# Patient Record
Sex: Female | Born: 1945 | Race: White | Hispanic: No | Marital: Married | Smoking: Never smoker
Health system: Southern US, Community
[De-identification: ages and names within clinical notes are randomized; demographics above are authoritative.]

## PROBLEM LIST (undated history)

## (undated) DIAGNOSIS — E7800 Pure hypercholesterolemia, unspecified: Secondary | ICD-10-CM

## (undated) DIAGNOSIS — K219 Gastro-esophageal reflux disease without esophagitis: Secondary | ICD-10-CM

## (undated) DIAGNOSIS — E785 Hyperlipidemia, unspecified: Secondary | ICD-10-CM

## (undated) HISTORY — PX: HERNIA REPAIR: SHX51

---

## 2000-03-11 ENCOUNTER — Other Ambulatory Visit: Admission: RE | Admit: 2000-03-11 | Discharge: 2000-03-11 | Payer: Self-pay | Admitting: *Deleted

## 2000-03-14 ENCOUNTER — Encounter: Payer: Self-pay | Admitting: *Deleted

## 2000-03-14 ENCOUNTER — Encounter: Admission: RE | Admit: 2000-03-14 | Discharge: 2000-03-14 | Payer: Self-pay | Admitting: *Deleted

## 2001-06-13 ENCOUNTER — Emergency Department (HOSPITAL_COMMUNITY): Admission: EM | Admit: 2001-06-13 | Discharge: 2001-06-13 | Payer: Self-pay | Admitting: Emergency Medicine

## 2004-04-05 ENCOUNTER — Emergency Department (HOSPITAL_COMMUNITY): Admission: EM | Admit: 2004-04-05 | Discharge: 2004-04-05 | Payer: Self-pay | Admitting: Emergency Medicine

## 2010-01-31 ENCOUNTER — Encounter: Admission: RE | Admit: 2010-01-31 | Discharge: 2010-01-31 | Payer: Self-pay | Admitting: General Surgery

## 2010-08-25 ENCOUNTER — Encounter (HOSPITAL_COMMUNITY): Payer: Self-pay | Admitting: Oncology

## 2012-10-26 ENCOUNTER — Encounter (HOSPITAL_COMMUNITY): Payer: Self-pay | Admitting: *Deleted

## 2012-10-26 ENCOUNTER — Emergency Department (HOSPITAL_COMMUNITY)
Admission: EM | Admit: 2012-10-26 | Discharge: 2012-10-26 | Disposition: A | Discharge status: Home / Self Care | Payer: Medicare Other | Attending: Emergency Medicine | Admitting: Emergency Medicine

## 2012-10-26 DIAGNOSIS — R21 Rash and other nonspecific skin eruption: Secondary | ICD-10-CM | POA: Insufficient documentation

## 2012-10-26 DIAGNOSIS — R0789 Other chest pain: Secondary | ICD-10-CM | POA: Insufficient documentation

## 2012-10-26 DIAGNOSIS — Z79899 Other long term (current) drug therapy: Secondary | ICD-10-CM | POA: Insufficient documentation

## 2012-10-26 DIAGNOSIS — H9209 Otalgia, unspecified ear: Secondary | ICD-10-CM | POA: Insufficient documentation

## 2012-10-26 DIAGNOSIS — E7800 Pure hypercholesterolemia, unspecified: Secondary | ICD-10-CM | POA: Insufficient documentation

## 2012-10-26 DIAGNOSIS — M542 Cervicalgia: Secondary | ICD-10-CM | POA: Insufficient documentation

## 2012-10-26 DIAGNOSIS — R142 Eructation: Secondary | ICD-10-CM | POA: Insufficient documentation

## 2012-10-26 DIAGNOSIS — R143 Flatulence: Secondary | ICD-10-CM | POA: Insufficient documentation

## 2012-10-26 DIAGNOSIS — R141 Gas pain: Secondary | ICD-10-CM | POA: Insufficient documentation

## 2012-10-26 DIAGNOSIS — K219 Gastro-esophageal reflux disease without esophagitis: Secondary | ICD-10-CM | POA: Insufficient documentation

## 2012-10-26 HISTORY — DX: Pure hypercholesterolemia, unspecified: E78.00

## 2012-10-26 HISTORY — DX: Gastro-esophageal reflux disease without esophagitis: K21.9

## 2012-10-26 NOTE — ED Notes (Addendum)
Pt reports that 4 weeks ago, she had what she thought was an allergic reaction-redness, chest tightness, back pain and being flushed.  Reports that she went to her PCP and then to an allergist and could not find anything wrong-put her on prednisone (pt on 3rd round at this time) and clindamycin.  States that the 'episodes' are intermittent and are becoming more frequent.  Reports chest tightness and back tightness from her shoulder blades up and from her ribs up. All reports a rash-that itches- that forms on her arms, face, chest and abdomen.  Pt ambulatory.  Noted to be red in the face, denies pain with palpation.

## 2012-10-26 NOTE — ED Provider Notes (Addendum)
History     CSN: [id number]  Arrival date & time 10/26/12  1101   First MD Initiated Contact with Patient 10/26/12 1156      Chief Complaint  Patient presents with  . Allergic Reaction  . Chest Pain    (Consider location/radiation/quality/duration/timing/severity/associated sxs/prior treatment) HPI Comments: Kara [name] is a 67 y.o. Female who states that, she has had indeterminate and chest discomfort, primarily at night, that it is a pressure-like sensation, radiating to her neck and both ears. The discomfort tends to improve when she stands up, or belches. It lasts a few minutes at a time. There are no other known causative factors. She is on her third prescription of prednisone within the last 4 weeks for a generalized pruritic rash. She has been seeing her PCP, and an allergist. She is due to have "a patch test", because the skin test did not uncover an etiology. There is no associated weakness, dizziness, nausea, vomiting, abdominal pain, back pain, fever, chills, or change in bowel and urinary habits. She has not tried any medication for the problem. She is taking clindamycin for a secondary infection from scratching her rash. There are no other known modified factors.  Patient is a 67 y.o. female presenting with allergic reaction and chest pain. The history is provided by the patient.  Allergic Reaction  Chest Pain   Past Medical History  Diagnosis Date  . Hypercholesteremia   . Acid reflux     Past Surgical History  Procedure Laterality Date  . Hernia repair      History reviewed. No pertinent family history.  History  Substance Use Topics  . Smoking status: Never Smoker   . Smokeless tobacco: Not on file  . Alcohol Use: Yes     Comment: socially    OB History   Grav Para Term Preterm Abortions TAB SAB Ect Mult Living                  Review of Systems  Cardiovascular: Positive for chest pain.  All other systems reviewed and are  negative.    Allergies  Amoxicillin and Penicillins  Home Medications   Current Outpatient Rx  Name  Route  Sig  Dispense  Refill  . clindamycin (CLEOCIN) 300 MG capsule   Oral   Take 300 mg by mouth 3 (three) times daily.         Marland Kitchen dexlansoprazole (DEXILANT) 60 MG capsule   Oral   Take 60 mg by mouth daily.         . predniSONE (DELTASONE) 10 MG tablet   Oral   Take 10-60 mg by mouth daily.         . rosuvastatin (CRESTOR) 20 MG tablet   Oral   Take 20 mg by mouth daily.         Marland Kitchen tetrahydrozoline (VISINE) 0.05 % ophthalmic solution   Both Eyes   Place 1 drop into both eyes 4 (four) times daily as needed (for dry/irritated eyes).           BP 164/94  Pulse 108  Temp(Src) 98.3 F (36.8 C) (Oral)  Resp 18  SpO2 96%  Physical Exam  Nursing note and vitals reviewed. Constitutional: She is oriented to person, place, and time. She appears well-developed and well-nourished.  HENT:  Head: Normocephalic and atraumatic.  Eyes: Conjunctivae and EOM are normal. Pupils are equal, round, and reactive to light.  Neck: Normal range of motion and phonation normal. Neck supple.  Cardiovascular: Normal rate, regular rhythm and intact distal pulses.   Pulmonary/Chest: Effort normal and breath sounds normal. She exhibits no tenderness.  Abdominal: Soft. She exhibits no distension. There is no tenderness. There is no guarding.  Musculoskeletal: Normal range of motion.  Neurological: She is alert and oriented to person, place, and time. She has normal strength. She exhibits normal muscle tone.  Skin: Skin is warm and dry.  Scattered flat, red rash, primarily on the arms bilaterally; the pattern is indistinct.   Psychiatric: Her behavior is normal. Judgment and thought content normal.  Anxious    ED Course  Procedures (including critical care time)    Date: 05/22/2012  Rate: 96  Rhythm: normal sinus rhythm  QRS Axis: normal  PR and QT Intervals: PR prolonged   ST/T Wave abnormalities: normal  PR and QRS Conduction Disutrbances:none  Narrative Interpretation:   Old EKG Reviewed: none available     Nursing Notes Reviewed/ Care Coordinated Applicable Imaging Reviewed Interpretation of Laboratory Data incorporated into ED treatment   1. Chest pain, atypical       MDM  Chest pain, primarily at rest with symptoms consistent with GERD. I suspect this has been aggravated by her prolonged use of prednisone, complicated by the addition of clindamycin. ACS, PE, pneumonia. Doubt metabolic instability, serious bacterial infection or impending vascular collapse; the patient is stable for discharge.   Plan: Home Medications- add Maalox; Home Treatments- rest; Recommended follow up- PCP and allergist as scheduled and as needed.        Flint Melter, MD 10/26/12 1226  Flint Melter, MD 10/26/12 [phone]

## 2013-04-22 ENCOUNTER — Other Ambulatory Visit (HOSPITAL_COMMUNITY)
Admission: RE | Admit: 2013-04-22 | Discharge: 2013-04-22 | Disposition: A | Discharge status: Home / Self Care | Payer: Medicare Other | Source: Ambulatory Visit | Attending: Family Medicine | Admitting: Family Medicine

## 2013-04-22 ENCOUNTER — Other Ambulatory Visit: Payer: Self-pay | Admitting: Family Medicine

## 2013-04-22 DIAGNOSIS — Z124 Encounter for screening for malignant neoplasm of cervix: Secondary | ICD-10-CM | POA: Insufficient documentation

## 2013-04-22 DIAGNOSIS — Z1151 Encounter for screening for human papillomavirus (HPV): Secondary | ICD-10-CM | POA: Insufficient documentation

## 2013-07-12 ENCOUNTER — Other Ambulatory Visit: Payer: Self-pay | Admitting: Dermatology

## 2013-10-25 ENCOUNTER — Other Ambulatory Visit: Payer: Self-pay | Admitting: Dermatology

## 2014-10-17 ENCOUNTER — Other Ambulatory Visit: Payer: Self-pay

## 2014-10-17 DIAGNOSIS — Z1231 Encounter for screening mammogram for malignant neoplasm of breast: Secondary | ICD-10-CM

## 2014-11-10 ENCOUNTER — Ambulatory Visit
Admission: RE | Admit: 2014-11-10 | Discharge: 2014-11-10 | Disposition: A | Discharge status: Home / Self Care | Payer: Medicare Other | Source: Ambulatory Visit

## 2014-11-10 ENCOUNTER — Encounter (INDEPENDENT_AMBULATORY_CARE_PROVIDER_SITE_OTHER): Payer: Self-pay

## 2014-11-10 ENCOUNTER — Other Ambulatory Visit: Payer: Self-pay

## 2014-11-10 DIAGNOSIS — Z1231 Encounter for screening mammogram for malignant neoplasm of breast: Secondary | ICD-10-CM

## 2014-11-14 ENCOUNTER — Other Ambulatory Visit: Payer: Self-pay | Admitting: Family Medicine

## 2014-11-14 DIAGNOSIS — R928 Other abnormal and inconclusive findings on diagnostic imaging of breast: Secondary | ICD-10-CM

## 2014-11-18 ENCOUNTER — Ambulatory Visit
Admission: RE | Admit: 2014-11-18 | Discharge: 2014-11-18 | Disposition: A | Discharge status: Home / Self Care | Payer: Medicare Other | Source: Ambulatory Visit | Attending: Family Medicine | Admitting: Family Medicine

## 2014-11-18 ENCOUNTER — Other Ambulatory Visit: Payer: Self-pay | Admitting: Family Medicine

## 2014-11-18 DIAGNOSIS — R928 Other abnormal and inconclusive findings on diagnostic imaging of breast: Secondary | ICD-10-CM

## 2015-05-11 ENCOUNTER — Other Ambulatory Visit: Payer: Self-pay | Admitting: Family Medicine

## 2015-05-11 DIAGNOSIS — N6489 Other specified disorders of breast: Secondary | ICD-10-CM

## 2015-05-29 ENCOUNTER — Ambulatory Visit
Admission: RE | Admit: 2015-05-29 | Discharge: 2015-05-29 | Disposition: A | Discharge status: Home / Self Care | Payer: Medicare Other | Source: Ambulatory Visit | Attending: Family Medicine | Admitting: Family Medicine

## 2015-05-29 DIAGNOSIS — N6489 Other specified disorders of breast: Secondary | ICD-10-CM

## 2015-06-21 ENCOUNTER — Other Ambulatory Visit: Payer: Self-pay | Admitting: Gastroenterology

## 2015-12-11 ENCOUNTER — Other Ambulatory Visit: Payer: Self-pay | Admitting: Family Medicine

## 2015-12-11 DIAGNOSIS — N6489 Other specified disorders of breast: Secondary | ICD-10-CM

## 2015-12-20 ENCOUNTER — Ambulatory Visit
Admission: RE | Admit: 2015-12-20 | Discharge: 2015-12-20 | Disposition: A | Discharge status: Home / Self Care | Payer: Medicare Other | Source: Ambulatory Visit | Attending: Family Medicine | Admitting: Family Medicine

## 2015-12-20 DIAGNOSIS — N6489 Other specified disorders of breast: Secondary | ICD-10-CM

## 2016-02-15 ENCOUNTER — Other Ambulatory Visit: Payer: Self-pay | Admitting: Family Medicine

## 2016-02-15 DIAGNOSIS — H5711 Ocular pain, right eye: Secondary | ICD-10-CM

## 2016-02-19 ENCOUNTER — Ambulatory Visit
Admission: RE | Admit: 2016-02-19 | Discharge: 2016-02-19 | Disposition: A | Discharge status: Home / Self Care | Payer: Medicare Other | Source: Ambulatory Visit | Attending: Family Medicine | Admitting: Family Medicine

## 2016-02-19 DIAGNOSIS — H5711 Ocular pain, right eye: Secondary | ICD-10-CM

## 2016-08-28 DIAGNOSIS — Z0000 Encounter for general adult medical examination without abnormal findings: Secondary | ICD-10-CM | POA: Diagnosis not present

## 2016-08-28 DIAGNOSIS — D582 Other hemoglobinopathies: Secondary | ICD-10-CM | POA: Diagnosis not present

## 2016-08-28 DIAGNOSIS — R69 Illness, unspecified: Secondary | ICD-10-CM | POA: Diagnosis not present

## 2016-08-28 DIAGNOSIS — Z1389 Encounter for screening for other disorder: Secondary | ICD-10-CM | POA: Diagnosis not present

## 2016-08-28 DIAGNOSIS — E7800 Pure hypercholesterolemia, unspecified: Secondary | ICD-10-CM | POA: Diagnosis not present

## 2016-10-08 DIAGNOSIS — H5213 Myopia, bilateral: Secondary | ICD-10-CM | POA: Diagnosis not present

## 2016-10-08 DIAGNOSIS — H2513 Age-related nuclear cataract, bilateral: Secondary | ICD-10-CM | POA: Diagnosis not present

## 2016-11-13 ENCOUNTER — Other Ambulatory Visit: Payer: Self-pay | Admitting: Family Medicine

## 2016-11-13 DIAGNOSIS — R922 Inconclusive mammogram: Secondary | ICD-10-CM

## 2016-12-20 ENCOUNTER — Ambulatory Visit
Admission: RE | Admit: 2016-12-20 | Discharge: 2016-12-20 | Disposition: A | Discharge status: Home / Self Care | Payer: Medicare Other | Source: Ambulatory Visit | Attending: Family Medicine | Admitting: Family Medicine

## 2016-12-20 DIAGNOSIS — R922 Inconclusive mammogram: Secondary | ICD-10-CM

## 2016-12-20 DIAGNOSIS — R928 Other abnormal and inconclusive findings on diagnostic imaging of breast: Secondary | ICD-10-CM | POA: Diagnosis not present

## 2017-02-25 DIAGNOSIS — R202 Paresthesia of skin: Secondary | ICD-10-CM | POA: Diagnosis not present

## 2017-02-25 DIAGNOSIS — K219 Gastro-esophageal reflux disease without esophagitis: Secondary | ICD-10-CM | POA: Diagnosis not present

## 2017-02-25 DIAGNOSIS — E7800 Pure hypercholesterolemia, unspecified: Secondary | ICD-10-CM | POA: Diagnosis not present

## 2017-02-25 DIAGNOSIS — R69 Illness, unspecified: Secondary | ICD-10-CM | POA: Diagnosis not present

## 2017-02-25 DIAGNOSIS — D582 Other hemoglobinopathies: Secondary | ICD-10-CM | POA: Diagnosis not present

## 2017-03-05 DIAGNOSIS — J0100 Acute maxillary sinusitis, unspecified: Secondary | ICD-10-CM | POA: Diagnosis not present

## 2017-03-05 DIAGNOSIS — H66001 Acute suppurative otitis media without spontaneous rupture of ear drum, right ear: Secondary | ICD-10-CM | POA: Diagnosis not present

## 2017-04-22 DIAGNOSIS — Z85828 Personal history of other malignant neoplasm of skin: Secondary | ICD-10-CM | POA: Diagnosis not present

## 2017-04-22 DIAGNOSIS — D2271 Melanocytic nevi of right lower limb, including hip: Secondary | ICD-10-CM | POA: Diagnosis not present

## 2017-04-22 DIAGNOSIS — D2261 Melanocytic nevi of right upper limb, including shoulder: Secondary | ICD-10-CM | POA: Diagnosis not present

## 2017-04-22 DIAGNOSIS — L821 Other seborrheic keratosis: Secondary | ICD-10-CM | POA: Diagnosis not present

## 2017-04-22 DIAGNOSIS — D2262 Melanocytic nevi of left upper limb, including shoulder: Secondary | ICD-10-CM | POA: Diagnosis not present

## 2017-05-10 DIAGNOSIS — H66001 Acute suppurative otitis media without spontaneous rupture of ear drum, right ear: Secondary | ICD-10-CM | POA: Diagnosis not present

## 2017-05-10 DIAGNOSIS — S0300XA Dislocation of jaw, unspecified side, initial encounter: Secondary | ICD-10-CM | POA: Diagnosis not present

## 2017-07-07 DIAGNOSIS — T368X5A Adverse effect of other systemic antibiotics, initial encounter: Secondary | ICD-10-CM | POA: Diagnosis not present

## 2017-07-07 DIAGNOSIS — M549 Dorsalgia, unspecified: Secondary | ICD-10-CM | POA: Diagnosis not present

## 2017-07-07 DIAGNOSIS — K219 Gastro-esophageal reflux disease without esophagitis: Secondary | ICD-10-CM | POA: Diagnosis not present

## 2017-07-10 ENCOUNTER — Emergency Department (HOSPITAL_BASED_OUTPATIENT_CLINIC_OR_DEPARTMENT_OTHER): Payer: Medicare HMO

## 2017-07-10 ENCOUNTER — Other Ambulatory Visit: Payer: Self-pay

## 2017-07-10 ENCOUNTER — Emergency Department (HOSPITAL_BASED_OUTPATIENT_CLINIC_OR_DEPARTMENT_OTHER)
Admission: EM | Admit: 2017-07-10 | Discharge: 2017-07-10 | Disposition: A | Discharge status: Home / Self Care | Payer: Medicare HMO | Attending: Emergency Medicine | Admitting: Emergency Medicine

## 2017-07-10 ENCOUNTER — Encounter (HOSPITAL_BASED_OUTPATIENT_CLINIC_OR_DEPARTMENT_OTHER): Payer: Self-pay | Admitting: *Deleted

## 2017-07-10 DIAGNOSIS — E860 Dehydration: Secondary | ICD-10-CM | POA: Diagnosis not present

## 2017-07-10 DIAGNOSIS — I951 Orthostatic hypotension: Secondary | ICD-10-CM | POA: Diagnosis not present

## 2017-07-10 DIAGNOSIS — R002 Palpitations: Secondary | ICD-10-CM | POA: Diagnosis not present

## 2017-07-10 DIAGNOSIS — Z79899 Other long term (current) drug therapy: Secondary | ICD-10-CM | POA: Insufficient documentation

## 2017-07-10 DIAGNOSIS — R079 Chest pain, unspecified: Secondary | ICD-10-CM | POA: Diagnosis not present

## 2017-07-10 HISTORY — DX: Hyperlipidemia, unspecified: E78.5

## 2017-07-10 HISTORY — DX: Gastro-esophageal reflux disease without esophagitis: K21.9

## 2017-07-10 LAB — CBC
HEMATOCRIT: 47.6 % — AB (ref 36.0–46.0)
HEMOGLOBIN: 16.9 g/dL — AB (ref 12.0–15.0)
MCH: 30.7 pg (ref 26.0–34.0)
MCHC: 35.5 g/dL (ref 30.0–36.0)
MCV: 86.5 fL (ref 78.0–100.0)
Platelets: 238 10*3/uL (ref 150–400)
RBC: 5.5 MIL/uL — ABNORMAL HIGH (ref 3.87–5.11)
RDW: 12.9 % (ref 11.5–15.5)
WBC: 7.7 10*3/uL (ref 4.0–10.5)

## 2017-07-10 LAB — BASIC METABOLIC PANEL
ANION GAP: 7 (ref 5–15)
BUN: 12 mg/dL (ref 6–20)
CHLORIDE: 106 mmol/L (ref 101–111)
CO2: 27 mmol/L (ref 22–32)
Calcium: 9.5 mg/dL (ref 8.9–10.3)
Creatinine, Ser: 0.69 mg/dL (ref 0.44–1.00)
GFR calc Af Amer: >60 mL/min (ref >60)
GFR calc non Af Amer: >60 mL/min (ref >60)
GLUCOSE: 121 mg/dL — AB (ref 65–99)
POTASSIUM: 4.4 mmol/L (ref 3.5–5.1)
Sodium: 140 mmol/L (ref 135–145)

## 2017-07-10 LAB — TROPONIN I: Troponin I: <0.03 ng/mL (ref <0.03)

## 2017-07-10 MED ORDER — SODIUM CHLORIDE 0.9 % IV BOLUS (SEPSIS)
1000.0000 mL | Freq: Once | INTRAVENOUS | Status: AC
Start: 2017-07-10 — End: 2017-07-10
  Administered 2017-07-10: 1000 mL via INTRAVENOUS

## 2017-07-10 NOTE — ED Triage Notes (Signed)
Pt c/o palpitations " on and off" x 1 week

## 2017-07-10 NOTE — ED Provider Notes (Signed)
[facility] EMERGENCY DEPARTMENT Provider Note  CSN: [id number] Arrival date & time: 07/10/17 1249  Chief Complaint(s) Palpitations  HPI Kara [name] is a 71 y.o. female who presents to the emergency department with palpitations and sensation of "jitteriness."  Patient reports that this has happened in the past intermittently, but has been more frequent recently.  Today she had an episode that lasted longer than usual.  She was out eating lunch with her son when her symptoms started.  Her son reports that she appeared to be "focusing on something internally."  She did not appear pale or diaphoretic.  During this episode patient denied any lightheadedness, chest pain, shortness of breath, abdominal pain.  She did have mild nausea without emesis.  She denies any recent fevers or infections.  No URI symptoms, GI symptoms or GU symptoms.  Her palpitations have stopped however patient still feels jittery inside.  Patient does report that she has been more stressed recently with the holidays coming and has not been hydrating as well as she normally does.  HPI  Past Medical History Past Medical History:  Diagnosis Date  . Acid reflux   . GERD (gastroesophageal reflux disease)   . Hypercholesteremia   . Hyperlipidemia    There are no active problems to display for this patient.  Home Medication(s) Prior to Admission medications   Medication Sig Start Date End Date Taking? Authorizing Provider  omeprazole (PRILOSEC) 20 MG capsule Take 20 mg by mouth daily.   Yes [provider]  rosuvastatin (CRESTOR) 20 MG tablet Take 20 mg by mouth daily.    [provider]  tetrahydrozoline (VISINE) 0.05 % ophthalmic solution Place 1 drop into both eyes 4 (four) times daily as needed (for dry/irritated eyes).    [provider]                                                                                                                                    Past  Surgical History Past Surgical History:  Procedure Laterality Date  . HERNIA REPAIR     Family History History reviewed. No pertinent family history.  Social History Social History   Tobacco Use  . Smoking status: Never Smoker  . Smokeless tobacco: Never Used  Substance Use Topics  . Alcohol use: Yes    Comment: socially  . Drug use: No   Allergies Amoxicillin and Penicillins  Review of Systems Review of Systems All other systems are reviewed and are negative for acute change except as noted in the HPI  Physical Exam Vital Signs  I have reviewed the triage vital signs BP (!) 174/92 (BP Location: Left Arm)   Pulse 100   Temp 98.8 F (37.1 C)   Resp 16   Ht 5\' 3"  (1.6 m)   Wt 77.1 kg (170 lb)   SpO2 99%   BMI 30.11 kg/m   Physical Exam  Constitutional: She  is oriented to person, place, and time. She appears well-developed and well-nourished. No distress.  HENT:  Head: Normocephalic and atraumatic.  Nose: Nose normal.  Eyes: Conjunctivae and EOM are normal. Pupils are equal, round, and reactive to light. Right eye exhibits no discharge. Left eye exhibits no discharge. No scleral icterus.  Neck: Normal range of motion. Neck supple.  Cardiovascular: Normal rate and regular rhythm. Exam reveals no gallop and no friction rub.  No murmur heard. Pulmonary/Chest: Effort normal and breath sounds normal. No stridor. No respiratory distress. She has no rales.  Abdominal: Soft. She exhibits no distension. There is no tenderness.  Musculoskeletal: She exhibits no edema or tenderness.  Neurological: She is alert and oriented to person, place, and time.  Skin: Skin is warm and dry. No rash noted. She is not diaphoretic. No erythema.  Psychiatric: She has a normal mood and affect.  Vitals reviewed.   ED Results and Treatments Labs (all labs ordered are listed, but only abnormal results are displayed) Labs Reviewed  BASIC METABOLIC PANEL - Abnormal; Notable for the  following components:      Result Value   Glucose, Bld 121 (*)    All other components within normal limits  CBC - Abnormal; Notable for the following components:   RBC 5.50 (*)    Hemoglobin 16.9 (*)    HCT 47.6 (*)    All other components within normal limits  TROPONIN I                                                                                                                         EKG  EKG Interpretation  Date/Time:  Thursday July 10 2017 12:54:15 EST Ventricular Rate:  110 PR Interval:  160 QRS Duration: 86 QT Interval:  328 QTC Calculation: 443 R Axis:   -38 Text Interpretation:  Sinus tachycardia Left axis deviation Minimal voltage criteria for LVH, may be normal variant Possible Anteroseptal infarct , age undetermined Abnormal ECG Otherwise no significant change Confirmed by Addison Lank [phone]) on 07/10/2017 1:18:41 PM Also confirmed by Addison Lank [phone]), editor Laurena Spies [phone])  on 07/10/2017 1:39:17 PM      Radiology Dg Chest 2 View  Result Date: 07/10/2017 CLINICAL DATA:  Chest pain EXAM: CHEST  2 VIEW COMPARISON:  01/31/2010 FINDINGS: The heart size and mediastinal contours are within normal limits. Both lungs are clear. Spondylosis noted in the thoracic spine. IMPRESSION: No active cardiopulmonary disease. Electronically Signed   By: Kerby Moors M.D.   On: 07/10/2017 13:29   Pertinent labs & imaging results that were available during my care of the patient were reviewed by me and considered in my medical decision making (see chart for details).  Medications Ordered in ED Medications  sodium chloride 0.9 % bolus 1,000 mL (0 mLs Intravenous Stopped 07/10/17 1449)  Procedures Procedures  (including critical care time)  Medical Decision Making / ED Course I have reviewed the nursing notes for this encounter and  the patient's prior records (if available in EHR or on provided paperwork).    EKG without acute ischemic changes or dysrhythmias.  No blocks.  On telemetry monitor I did note intermittent PACs.  Orthostatics were positive.  Labs were grossly reassuring without significant electrolyte derangements or renal insufficiency.  CBC did appear hemoconcentrated.  She was provided with IV fluid bolus which completely resolved the patient's jitteriness.  Repeat orthostatics were reassuring.  The patient is safe for discharge with strict return precautions.   Final Clinical Impression(s) / ED Diagnoses Final diagnoses:  Palpitations  Orthostasis  Dehydration   Disposition: Discharge  Condition: Good  I have discussed the results, Dx and Tx plan with the patient who expressed understanding and agree(s) with the plan. Discharge instructions discussed at great length. The patient was given strict return precautions who verbalized understanding of the instructions. No further questions at time of discharge.    ED Discharge Orders    None       Follow Up: [address], [place] [address] [phone]  Schedule an appointment as soon as possible for a visit  As needed      This chart was dictated using voice recognition software.  Despite best efforts to proofread,  errors can occur which can change the documentation meaning.   Fatima Blank, MD 07/10/17 [phone]

## 2017-07-21 DIAGNOSIS — J0140 Acute pansinusitis, unspecified: Secondary | ICD-10-CM | POA: Diagnosis not present

## 2017-07-21 DIAGNOSIS — H66001 Acute suppurative otitis media without spontaneous rupture of ear drum, right ear: Secondary | ICD-10-CM | POA: Diagnosis not present

## 2017-09-01 DIAGNOSIS — Z0000 Encounter for general adult medical examination without abnormal findings: Secondary | ICD-10-CM | POA: Diagnosis not present

## 2017-09-01 DIAGNOSIS — E7800 Pure hypercholesterolemia, unspecified: Secondary | ICD-10-CM | POA: Diagnosis not present

## 2017-09-01 DIAGNOSIS — Z1389 Encounter for screening for other disorder: Secondary | ICD-10-CM | POA: Diagnosis not present

## 2017-09-01 DIAGNOSIS — K219 Gastro-esophageal reflux disease without esophagitis: Secondary | ICD-10-CM | POA: Diagnosis not present

## 2017-09-01 DIAGNOSIS — D582 Other hemoglobinopathies: Secondary | ICD-10-CM | POA: Diagnosis not present

## 2017-09-01 DIAGNOSIS — R69 Illness, unspecified: Secondary | ICD-10-CM | POA: Diagnosis not present

## 2017-09-08 ENCOUNTER — Telehealth: Payer: Self-pay | Admitting: Hematology and Oncology

## 2017-09-08 ENCOUNTER — Encounter: Payer: Self-pay | Admitting: Hematology and Oncology

## 2017-09-08 NOTE — Telephone Encounter (Signed)
Appt has been scheduled for the pt to see Dr. Lebron Conners on 3/4 at 11am. Address verified. Letter mailed to the pt and faxed to the referring office.

## 2017-10-06 ENCOUNTER — Telehealth: Payer: Self-pay | Admitting: Hematology and Oncology

## 2017-10-06 ENCOUNTER — Encounter: Payer: Medicare HMO | Admitting: Hematology and Oncology

## 2017-10-06 NOTE — Telephone Encounter (Signed)
Pt cld to cancel today's appt due to an illness. Appt has been rescheduled to see Dr. Lebron Conners on 3/22 at 2pm.

## 2017-10-14 DIAGNOSIS — H5213 Myopia, bilateral: Secondary | ICD-10-CM | POA: Diagnosis not present

## 2017-10-14 DIAGNOSIS — H2513 Age-related nuclear cataract, bilateral: Secondary | ICD-10-CM | POA: Diagnosis not present

## 2017-10-24 ENCOUNTER — Inpatient Hospital Stay: Payer: Medicare HMO

## 2017-10-24 ENCOUNTER — Encounter: Payer: Self-pay | Admitting: Hematology and Oncology

## 2017-10-24 ENCOUNTER — Telehealth: Payer: Self-pay | Admitting: Hematology and Oncology

## 2017-10-24 ENCOUNTER — Inpatient Hospital Stay: Payer: Medicare HMO | Attending: Hematology and Oncology | Admitting: Hematology and Oncology

## 2017-10-24 VITALS — BP 136/92 | HR 94 | Temp 97.8°F | Resp 20 | Ht 63.0 in | Wt 188.6 lb

## 2017-10-24 DIAGNOSIS — D751 Secondary polycythemia: Secondary | ICD-10-CM | POA: Diagnosis present

## 2017-10-24 DIAGNOSIS — E7800 Pure hypercholesterolemia, unspecified: Secondary | ICD-10-CM | POA: Diagnosis not present

## 2017-10-24 DIAGNOSIS — Z7722 Contact with and (suspected) exposure to environmental tobacco smoke (acute) (chronic): Secondary | ICD-10-CM | POA: Diagnosis not present

## 2017-10-24 DIAGNOSIS — F419 Anxiety disorder, unspecified: Secondary | ICD-10-CM | POA: Diagnosis not present

## 2017-10-24 DIAGNOSIS — D582 Other hemoglobinopathies: Secondary | ICD-10-CM | POA: Diagnosis not present

## 2017-10-24 DIAGNOSIS — R69 Illness, unspecified: Secondary | ICD-10-CM | POA: Diagnosis not present

## 2017-10-24 DIAGNOSIS — K219 Gastro-esophageal reflux disease without esophagitis: Secondary | ICD-10-CM | POA: Diagnosis not present

## 2017-10-24 DIAGNOSIS — Z79899 Other long term (current) drug therapy: Secondary | ICD-10-CM | POA: Diagnosis not present

## 2017-10-24 LAB — CMP (CANCER CENTER ONLY)
ALBUMIN: 4.4 g/dL (ref 3.5–5.0)
ALK PHOS: 92 U/L (ref 40–150)
ALT: 19 U/L (ref 0–55)
AST: 16 U/L (ref 5–34)
Anion gap: 10 (ref 3–11)
BILIRUBIN TOTAL: 1.7 mg/dL — AB (ref 0.2–1.2)
BUN: 14 mg/dL (ref 7–26)
CALCIUM: 10 mg/dL (ref 8.4–10.4)
CO2: 25 mmol/L (ref 22–29)
CREATININE: 0.8 mg/dL (ref 0.60–1.10)
Chloride: 106 mmol/L (ref 98–109)
GFR, Est AFR Am: >60 mL/min (ref >60)
GFR, Estimated: >60 mL/min (ref >60)
Glucose, Bld: 93 mg/dL (ref 70–140)
Potassium: 3.9 mmol/L (ref 3.5–5.1)
Sodium: 141 mmol/L (ref 136–145)
TOTAL PROTEIN: 7.2 g/dL (ref 6.4–8.3)

## 2017-10-24 LAB — CBC WITH DIFFERENTIAL (CANCER CENTER ONLY)
BASOS PCT: 1 %
Basophils Absolute: 0 10*3/uL (ref 0.0–0.1)
Eosinophils Absolute: 0.1 10*3/uL (ref 0.0–0.5)
Eosinophils Relative: 1 %
HEMATOCRIT: 48.5 % — AB (ref 34.8–46.6)
HEMOGLOBIN: 16.6 g/dL — AB (ref 11.6–15.9)
Lymphocytes Relative: 27 %
Lymphs Abs: 2.4 10*3/uL (ref 0.9–3.3)
MCH: 30.3 pg (ref 25.1–34.0)
MCHC: 34.3 g/dL (ref 31.5–36.0)
MCV: 88.5 fL (ref 79.5–101.0)
Monocytes Absolute: 0.6 10*3/uL (ref 0.1–0.9)
Monocytes Relative: 7 %
NEUTROS ABS: 5.7 10*3/uL (ref 1.5–6.5)
NEUTROS PCT: 64 %
Platelet Count: 236 10*3/uL (ref 145–400)
RBC: 5.48 MIL/uL — ABNORMAL HIGH (ref 3.70–5.45)
RDW: 12.9 % (ref 11.2–14.5)
WBC Count: 8.8 10*3/uL (ref 3.9–10.3)

## 2017-10-24 LAB — LACTATE DEHYDROGENASE: LDH: 186 U/L (ref 125–245)

## 2017-10-24 NOTE — Telephone Encounter (Signed)
Appointments scheduled AVS printed per 3/22 los ° °

## 2017-10-27 LAB — ERYTHROPOIETIN: Erythropoietin: 14.1 m[IU]/mL (ref 2.6–18.5)

## 2017-10-30 DIAGNOSIS — D751 Secondary polycythemia: Secondary | ICD-10-CM | POA: Insufficient documentation

## 2017-10-30 NOTE — Progress Notes (Signed)
[facility] New Visit:  Assessment: Elevated hemoglobin ([name]) 72 y.o. female with no significant contributory medical history, but mildly elevated hemoglobin and hematocrit without any symptoms suggestive of polycythemia.  Plan: -Labs today as outlined below. -Return to clinic in 1 week to discuss the findings.   Voice recognition software was used and creation of this note. Despite my best effort at editing the text, some misspelling/errors may have occurred. Orders Placed This Encounter  Procedures  . CBC with Differential ([facility] Only)  . CMP ([facility] only)  . Lactate dehydrogenase (LDH)  . Erythropoietin    All questions were answered.  . The patient knows to call the clinic with any problems, questions or concerns.  This note was electronically signed.    History of Presenting Illness Kara [name] 72 y.o. presenting to the [facility] for persistent hemoglobin evaluation, referred by Dr Kara [name].  Patient's past medical history significant for insomnia, hyperlipidemia, GERD.  Patient is a non-smoker, but has significant exposure to secondhand smoke.  Of note, her son also has elevated hemoglobin, patient  is not aware of any positive diagnosis in her son.  This time, patient denies any fevers, chills, night sweats.  No facial flushing or redness or pain in the palm of the hands or soles of the feet.  Patient denies abdominal pain, fullness, or early satiety.  No headaches, vision changes, or focal neurological deficits.  Oncological/hematological History: --Labs, 07/10/17: WBC 7.7, Hgb 16.9. Hct 47.6, Plt 238 --Labs, 09/01/17: WBC 7.7, Hgb 16.4, Hct 48.0, Plt 234  Medical History: Past Medical History:  Diagnosis Date  . Acid reflux   . GERD (gastroesophageal reflux disease)   . Hypercholesteremia   . Hyperlipidemia     Surgical History: Past Surgical History:  Procedure Laterality Date  . HERNIA REPAIR      Family  History: No family history on file.  Social History: Social History   Socioeconomic History  . Marital status: Married    Spouse name: Not on file  . Number of children: Not on file  . Years of education: Not on file  . Highest education level: Not on file  Occupational History  . Not on file  Social Needs  . Financial resource strain: Not on file  . Food insecurity:    Worry: Not on file    Inability: Not on file  . Transportation needs:    Medical: Not on file    Non-medical: Not on file  Tobacco Use  . Smoking status: Never Smoker  . Smokeless tobacco: Never Used  Substance and Sexual Activity  . Alcohol use: Yes    Comment: socially  . Drug use: No  . Sexual activity: Not on file  Lifestyle  . Physical activity:    Days per week: Not on file    Minutes per session: Not on file  . Stress: Not on file  Relationships  . Social connections:    Talks on phone: Not on file    Gets together: Not on file    Attends religious service: Not on file    Active member of club or organization: Not on file    Attends meetings of clubs or organizations: Not on file    Relationship status: Not on file  . Intimate partner violence:    Fear of current or ex partner: Not on file    Emotionally abused: Not on file    Physically abused: Not on file  Forced sexual activity: Not on file  Other Topics Concern  . Not on file  Social History Narrative  . Not on file    Allergies: Allergies  Allergen Reactions  . Amoxicillin Hives  . Penicillins Hives    Medications:  Current Outpatient Medications  Medication Sig Dispense Refill  . ALPRAZolam (XANAX) 0.5 MG tablet Take 0.5 mg by mouth daily as needed for anxiety.    Marland Kitchen omeprazole (PRILOSEC) 20 MG capsule Take 20 mg by mouth daily.    . rosuvastatin (CRESTOR) 20 MG tablet Take 20 mg by mouth daily.    Marland Kitchen tetrahydrozoline (VISINE) 0.05 % ophthalmic solution Place 1 drop into both eyes 4 (four) times daily as needed (for  dry/irritated eyes).     No current facility-administered medications for this visit.     Review of Systems: Review of Systems  All other systems reviewed and are negative.    PHYSICAL EXAMINATION Blood pressure (!) 136/92, pulse 94, temperature 97.8 F (36.6 C), temperature source Oral, resp. rate 20, height '5\' 3"'$  (1.6 m), weight 188 lb 9.6 oz (85.5 kg), SpO2 97 %.  ECOG PERFORMANCE STATUS: 0 - Asymptomatic  Physical Exam  Constitutional: She is oriented to person, place, and time and well-developed, well-nourished, and in no distress. No distress.  HENT:  Head: Normocephalic and atraumatic.  Mouth/Throat: Oropharynx is clear and moist. No oropharyngeal exudate.  Eyes: Pupils are equal, round, and reactive to light. Conjunctivae and EOM are normal. No scleral icterus.  Neck: No thyromegaly present.  Cardiovascular: Normal rate, regular rhythm, normal heart sounds and intact distal pulses.  No murmur heard. Pulmonary/Chest: Effort normal and breath sounds normal. No respiratory distress. She has no wheezes. She has no rales.  Abdominal: Soft. Bowel sounds are normal. She exhibits no distension and no mass. There is no tenderness. There is no guarding.  Musculoskeletal: She exhibits no edema.  Lymphadenopathy:    She has no cervical adenopathy.  Neurological: She is alert and oriented to person, place, and time. She has normal reflexes. No cranial nerve deficit.  Skin: Skin is warm and dry. No rash noted. She is not diaphoretic. No erythema. No pallor.     LABORATORY DATA: I have personally reviewed the data as listed: Office Visit on 10/24/2017  Component Date Value Ref Range Status  . WBC Count 10/24/2017 8.8  3.9 - 10.3 K/uL Final  . RBC 10/24/2017 5.48* 3.70 - 5.45 MIL/uL Final  . Hemoglobin 10/24/2017 16.6* 11.6 - 15.9 g/dL Final  . HCT 10/24/2017 48.5* 34.8 - 46.6 % Final  . MCV 10/24/2017 88.5  79.5 - 101.0 fL Final  . MCH 10/24/2017 30.3  25.1 - 34.0 pg Final  .  MCHC 10/24/2017 34.3  31.5 - 36.0 g/dL Final  . RDW 10/24/2017 12.9  11.2 - 14.5 % Final  . Platelet Count 10/24/2017 236  145 - 400 K/uL Final  . Neutrophils Relative % 10/24/2017 64  % Final  . Neutro Abs 10/24/2017 5.7  1.5 - 6.5 K/uL Final  . Lymphocytes Relative 10/24/2017 27  % Final  . Lymphs Abs 10/24/2017 2.4  0.9 - 3.3 K/uL Final  . Monocytes Relative 10/24/2017 7  % Final  . Monocytes Absolute 10/24/2017 0.6  0.1 - 0.9 K/uL Final  . Eosinophils Relative 10/24/2017 1  % Final  . Eosinophils Absolute 10/24/2017 0.1  0.0 - 0.5 K/uL Final  . Basophils Relative 10/24/2017 1  % Final  . Basophils Absolute 10/24/2017 0.0  0.0 - 0.1  K/uL Final   Performed at [facility], [place] [address], [place], [place] [address]  . Sodium 10/24/2017 141  136 - 145 mmol/L Final  . Potassium 10/24/2017 3.9  3.5 - 5.1 mmol/L Final  . Chloride 10/24/2017 106  98 - 109 mmol/L Final  . CO2 10/24/2017 25  22 - 29 mmol/L Final  . Glucose, Bld 10/24/2017 93  70 - 140 mg/dL Final  . BUN 10/24/2017 14  7 - 26 mg/dL Final  . Creatinine 10/24/2017 0.80  0.60 - 1.10 mg/dL Final  . Calcium 10/24/2017 10.0  8.4 - 10.4 mg/dL Final  . Total Protein 10/24/2017 7.2  6.4 - 8.3 g/dL Final  . Albumin 10/24/2017 4.4  3.5 - 5.0 g/dL Final  . AST 10/24/2017 16  5 - 34 U/L Final  . ALT 10/24/2017 19  0 - 55 U/L Final  . Alkaline Phosphatase 10/24/2017 92  40 - 150 U/L Final  . Total Bilirubin 10/24/2017 1.7* 0.2 - 1.2 mg/dL Final  . GFR, Est Non Af Am 10/24/2017 >60  >60 mL/min Final  . GFR, Est AFR Am 10/24/2017 >60  >60 mL/min Final   Comment: (NOTE) The eGFR has been calculated using the CKD EPI equation. This calculation has not been validated in all clinical situations. eGFR's persistently <60 mL/min signify possible Chronic Kidney Disease.   Georgiann Hahn gap 10/24/2017 10  3 - 11 Final   Performed at [facility], [place] [address] [place], [place] [address]  . LDH  10/24/2017 186  125 - 245 U/L Final   Performed at [facility], [place] [address], [place], [place] [address]  . Erythropoietin 10/24/2017 14.1  2.6 - 18.5 mIU/mL Final   Comment: (NOTE) [employer] UniCel DxI Leonardo obtained with different assay methods or kits cannot be used interchangeably. Results cannot be interpreted as absolute evidence of the presence or absence of malignant disease. Performed At: [facility] [place], [place] [id number] Rush Farmer MD KO:[id number] Performed at [facility], [place] [address] [place], [place] [address]          Ardath Sax, MD

## 2017-10-30 NOTE — Assessment & Plan Note (Signed)
72 y.o. female with no significant contributory medical history, but mildly elevated hemoglobin and hematocrit without any symptoms suggestive of polycythemia.  Plan: -Labs today as outlined below. -Return to clinic in 1 week to discuss the findings.

## 2017-10-31 ENCOUNTER — Telehealth: Payer: Self-pay | Admitting: Hematology and Oncology

## 2017-10-31 ENCOUNTER — Inpatient Hospital Stay (HOSPITAL_BASED_OUTPATIENT_CLINIC_OR_DEPARTMENT_OTHER): Payer: Medicare HMO | Admitting: Hematology and Oncology

## 2017-10-31 ENCOUNTER — Encounter: Payer: Self-pay | Admitting: Hematology and Oncology

## 2017-10-31 VITALS — BP 152/83 | HR 85 | Temp 98.0°F | Resp 17 | Ht 63.0 in | Wt 186.8 lb

## 2017-10-31 DIAGNOSIS — E7800 Pure hypercholesterolemia, unspecified: Secondary | ICD-10-CM | POA: Diagnosis not present

## 2017-10-31 DIAGNOSIS — D751 Secondary polycythemia: Secondary | ICD-10-CM | POA: Diagnosis not present

## 2017-10-31 DIAGNOSIS — K219 Gastro-esophageal reflux disease without esophagitis: Secondary | ICD-10-CM

## 2017-10-31 DIAGNOSIS — Z79899 Other long term (current) drug therapy: Secondary | ICD-10-CM | POA: Diagnosis not present

## 2017-10-31 DIAGNOSIS — F419 Anxiety disorder, unspecified: Secondary | ICD-10-CM | POA: Diagnosis not present

## 2017-10-31 DIAGNOSIS — Z7722 Contact with and (suspected) exposure to environmental tobacco smoke (acute) (chronic): Secondary | ICD-10-CM | POA: Diagnosis not present

## 2017-10-31 DIAGNOSIS — R69 Illness, unspecified: Secondary | ICD-10-CM | POA: Diagnosis not present

## 2017-10-31 DIAGNOSIS — D582 Other hemoglobinopathies: Secondary | ICD-10-CM | POA: Diagnosis not present

## 2017-10-31 NOTE — Telephone Encounter (Signed)
Appointments scheduled AVS/Calendar printed per 3/29 los °

## 2017-11-02 NOTE — Progress Notes (Signed)
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  Previous social history significant for secondhand exposure to tobacco smoke.  Erythropoietin level is elevated at 14.1 with persistent and may be progressive elevation in hemoglobin suggestive of secondary polycythemia.  Next phase and evaluation would be to assess for possible presence of inappropriate erythropoietin production such as presence of renal cell carcinoma, uterine malignancies, or hepatocellular carcinoma which have been shown to produce erythropoietin.  Plan: -CT of the abdomen and pelvis to assess for possible presence of liver, renal, or uterine tumors. -Chest x-ray and pulmonary function test to assess for possible presence of undiagnosed COPD -Return to clinic in 6 months for continued hematological monitoring or sooner if significant findings are obtained during the imaging.  Therapeutic phlebotomy will be reserved for hematocrit exceeding 55%.   Voice recognition software was used and creation of this note. Despite my best effort at editing the text, some misspelling/errors may have occurred.  Orders Placed This Encounter  Procedures  . DG Chest 2 View    Standing Status:   Future    Standing Expiration Date:   10/31/2018    Order Specific Question:   Reason for Exam (SYMPTOM  OR DIAGNOSIS REQUIRED)    Answer:   Elevated erythropoietin, please eval for COPD    Order Specific Question:   Preferred imaging location?    Answer:   [facility]    Order Specific Question:   Radiology Contrast Protocol - do NOT remove file path    Answer:   \\[email or web address]\[email or web address].[email or web address]  . CT Abdomen Pelvis W Contrast    Standing Status:   Future    Standing Expiration Date:   10/31/2018    Order Specific  Question:   If indicated for the ordered procedure, I authorize the administration of contrast media per Radiology protocol    Answer:   Yes    Order Specific Question:   Preferred imaging location?    Answer:   [facility]    Order Specific Question:   Radiology Contrast Protocol - do NOT remove file path    Answer:   \\[email or web address]\CTProtocols.[email or web address]    Order Specific Question:   Reason for Exam additional comments    Answer:   Elevated epo, please eval for hepatic, renal, or uterine abnormalities to suggest malignancy  . CBC with Differential (Cancer Center Only)    Standing Status:   Future    Standing Expiration Date:   11/01/2018  . CMP ([place] only)    Standing Status:   Future    Standing Expiration Date:   11/01/2018  . Lactate dehydrogenase (LDH)    Standing Status:   Future    Standing Expiration Date:   10/31/2018  . Erythropoietin    Standing Status:   Future    Standing Expiration Date:   10/31/2018  . Pulmonary Function Test    Standing Status:   Future    Standing Expiration Date:   11/01/2018    Order Specific Question:   Where should this test be performed?    Answer:   [facility]    Order Specific Question:   Full PFT: includes the following: basic spirometry, spirometry pre & post bronchodilator, diffusion capacity (DLCO), lung volumes    Answer:   Full PFT    Order Specific Question:   MIP/MEP    Answer:  No    Order Specific Question:   6 minute walk    Answer:   No    Order Specific Question:   ABG    Answer:   Yes    Order Specific Question:   Diffusion capacity (DLCO)    Answer:   Yes    Order Specific Question:   Lung volumes    Answer:   No    Order Specific Question:   Methacholine challenge    Answer:   No    Cancer Staging No matching staging information was found for the patient.  All questions were answered. . The patient knows to call the clinic with any problems, questions or concerns.  This note was  electronically signed.    History of Presenting Illness Kara [name] is a 72 y.o. 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  Patient's past medical history significant for insomnia, hyperlipidemia, GERD.  Patient is a non-smoker, but has significant exposure to secondhand smoke both from both parents smoking during her childhood as well as during prior previous work.  Of note, her son also has elevated hemoglobin, patient  is not aware of any positive diagnosis in her son.  This time, patient denies any fevers, chills, night sweats.  No facial flushing or redness or pain in the palm of the hands or soles of the feet.  Patient denies abdominal pain, fullness, or early satiety.  No headaches, vision changes, or focal neurological deficits.  Oncological/hematological History: --Labs, 07/10/17: WBC 7.7, Hgb 16.9. Hct 47.6, Plt 238 --Labs, 09/01/17: WBC 7.7, Hgb 16.4, Hct 48.0, Plt 234 --Labs, 10/24/17: WBC 8.8, Hgb 16.6, Hct 48.5, Plt 236; Epo 14.1  No history exists.    Medical History: Past Medical History:  Diagnosis Date  . Acid reflux   . GERD (gastroesophageal reflux disease)   . Hypercholesteremia   . Hyperlipidemia     Surgical History: Past Surgical History:  Procedure Laterality Date  . HERNIA REPAIR      Family History: History reviewed. No pertinent family history.  Social History: Social History   Socioeconomic History  . Marital status: Married    Spouse name: Not on file  . Number of children: Not on file  . Years of education: Not on file  . Highest education level: Not on file  Occupational History  . Not on file  Social Needs  . Financial resource strain: Not on file  . Food insecurity:    Worry: Not on file    Inability: Not on file  . Transportation needs:    Medical: Not on file    Non-medical: Not on file  Tobacco Use  . Smoking status: Never Smoker  . Smokeless tobacco: Never Used   Substance and Sexual Activity  . Alcohol use: Yes    Comment: socially  . Drug use: No  . Sexual activity: Not on file  Lifestyle  . Physical activity:    Days per week: Not on file    Minutes per session: Not on file  . Stress: Not on file  Relationships  . Social connections:    Talks on phone: Not on file    Gets together: Not on file    Attends religious service: Not on file    Active member of club or organization: Not on file    Attends meetings of clubs or organizations: Not on file    Relationship status: Not on file  . Intimate  partner violence:    Fear of current or ex partner: Not on file    Emotionally abused: Not on file    Physically abused: Not on file    Forced sexual activity: Not on file  Other Topics Concern  . Not on file  Social History Narrative  . Not on file    Allergies: Allergies  Allergen Reactions  . Amoxicillin Hives  . Penicillins Hives    Medications:  Current Outpatient Medications  Medication Sig Dispense Refill  . ALPRAZolam (XANAX) 0.5 MG tablet Take 0.5 mg by mouth daily as needed for anxiety.    Marland Kitchen omeprazole (PRILOSEC) 20 MG capsule Take 20 mg by mouth daily.    . rosuvastatin (CRESTOR) 20 MG tablet Take 20 mg by mouth daily.    Marland Kitchen tetrahydrozoline (VISINE) 0.05 % ophthalmic solution Place 1 drop into both eyes 4 (four) times daily as needed (for dry/irritated eyes).     No current facility-administered medications for this visit.     Review of Systems: Review of Systems  All other systems reviewed and are negative.    PHYSICAL EXAMINATION Blood pressure (!) 152/83, pulse 85, temperature 98 F (36.7 C), temperature source Oral, resp. rate 17, height '5\' 3"'$  (1.6 m), weight 186 lb 12.8 oz (84.7 kg), SpO2 99 %.  ECOG PERFORMANCE STATUS: 0 - Asymptomatic  Physical Exam  Constitutional: She is oriented to person, place, and time and well-developed, well-nourished, and in no distress. No distress.  HENT:  Head: Normocephalic  and atraumatic.  Mouth/Throat: Oropharynx is clear and moist. No oropharyngeal exudate.  Eyes: Pupils are equal, round, and reactive to light. Conjunctivae and EOM are normal. No scleral icterus.  Neck: No thyromegaly present.  Cardiovascular: Normal rate, regular rhythm, normal heart sounds and intact distal pulses.  No murmur heard. Pulmonary/Chest: Effort normal and breath sounds normal. No respiratory distress. She has no wheezes. She has no rales.  Abdominal: Soft. Bowel sounds are normal. She exhibits no distension and no mass. There is no tenderness. There is no guarding.  Musculoskeletal: She exhibits no edema.  Lymphadenopathy:    She has no cervical adenopathy.  Neurological: She is alert and oriented to person, place, and time. She has normal reflexes. No cranial nerve deficit.  Skin: Skin is warm and dry. No rash noted. She is not diaphoretic. No erythema. No pallor.     LABORATORY DATA: I have personally reviewed the data as listed: No visits with results within 1 Week(s) from this visit.  Latest known visit with results is:  Office Visit on 10/24/2017  Component Date Value Ref Range Status  . WBC Count 10/24/2017 8.8  3.9 - 10.3 K/uL Final  . RBC 10/24/2017 5.48* 3.70 - 5.45 MIL/uL Final  . Hemoglobin 10/24/2017 16.6* 11.6 - 15.9 g/dL Final  . HCT 10/24/2017 48.5* 34.8 - 46.6 % Final  . MCV 10/24/2017 88.5  79.5 - 101.0 fL Final  . MCH 10/24/2017 30.3  25.1 - 34.0 pg Final  . MCHC 10/24/2017 34.3  31.5 - 36.0 g/dL Final  . RDW 10/24/2017 12.9  11.2 - 14.5 % Final  . Platelet Count 10/24/2017 236  145 - 400 K/uL Final  . Neutrophils Relative % 10/24/2017 64  % Final  . Neutro Abs 10/24/2017 5.7  1.5 - 6.5 K/uL Final  . Lymphocytes Relative 10/24/2017 27  % Final  . Lymphs Abs 10/24/2017 2.4  0.9 - 3.3 K/uL Final  . Monocytes Relative 10/24/2017 7  % Final  .  Monocytes Absolute 10/24/2017 0.6  0.1 - 0.9 K/uL Final  . Eosinophils Relative 10/24/2017 1  % Final  .  Eosinophils Absolute 10/24/2017 0.1  0.0 - 0.5 K/uL Final  . Basophils Relative 10/24/2017 1  % Final  . Basophils Absolute 10/24/2017 0.0  0.0 - 0.1 K/uL Final   Performed at [facility], [place] [address], [place], [place] [address]  . Sodium 10/24/2017 141  136 - 145 mmol/L Final  . Potassium 10/24/2017 3.9  3.5 - 5.1 mmol/L Final  . Chloride 10/24/2017 106  98 - 109 mmol/L Final  . CO2 10/24/2017 25  22 - 29 mmol/L Final  . Glucose, Bld 10/24/2017 93  70 - 140 mg/dL Final  . BUN 10/24/2017 14  7 - 26 mg/dL Final  . Creatinine 10/24/2017 0.80  0.60 - 1.10 mg/dL Final  . Calcium 10/24/2017 10.0  8.4 - 10.4 mg/dL Final  . Total Protein 10/24/2017 7.2  6.4 - 8.3 g/dL Final  . Albumin 10/24/2017 4.4  3.5 - 5.0 g/dL Final  . AST 10/24/2017 16  5 - 34 U/L Final  . ALT 10/24/2017 19  0 - 55 U/L Final  . Alkaline Phosphatase 10/24/2017 92  40 - 150 U/L Final  . Total Bilirubin 10/24/2017 1.7* 0.2 - 1.2 mg/dL Final  . GFR, Est Non Af Am 10/24/2017 >60  >60 mL/min Final  . GFR, Est AFR Am 10/24/2017 >60  >60 mL/min Final   Comment: (NOTE) The eGFR has been calculated using the CKD EPI equation. This calculation has not been validated in all clinical situations. eGFR's persistently <60 mL/min signify possible Chronic Kidney Disease.   Georgiann Hahn gap 10/24/2017 10  3 - 11 Final   Performed at [facility], [place] [address], [place], [place] [address]  . LDH 10/24/2017 186  125 - 245 U/L Final   Performed at [facility], [place] [address] [place], [place] [address]  . Erythropoietin 10/24/2017 14.1  2.6 - 18.5 mIU/mL Final   Comment: (NOTE) [facility] [employer] UniCel DxI [place] obtained with different assay methods or kits cannot be used interchangeably. Results cannot be interpreted as absolute evidence of the presence or absence of malignant disease. Performed At: [facility] [place], [place] [id number] Rush Farmer MD PT:[id number] Performed at [facility], [place] [address], [place], [place] [address]        Ardath Sax, MD

## 2017-11-02 NOTE — Assessment & Plan Note (Signed)
72 y.o. female with no significant contributory medical history, but mildly elevated hemoglobin and hematocrit without any symptoms suggestive of polycythemia.  Previous social history significant for secondhand exposure to tobacco smoke.  Erythropoietin level is elevated at 14.1 with persistent and may be progressive elevation in hemoglobin suggestive of secondary polycythemia.  Next phase and evaluation would be to assess for possible presence of inappropriate erythropoietin production such as presence of renal cell carcinoma, uterine malignancies, or hepatocellular carcinoma which have been shown to produce erythropoietin.  Plan: -CT of the abdomen and pelvis to assess for possible presence of liver, renal, or uterine tumors. -Chest x-ray and pulmonary function test to assess for possible presence of undiagnosed COPD -Return to clinic in 6 months for continued hematological monitoring or sooner if significant findings are obtained during the imaging.  Therapeutic phlebotomy will be reserved for hematocrit exceeding 55%.

## 2017-11-11 ENCOUNTER — Ambulatory Visit (HOSPITAL_COMMUNITY): Payer: Medicare HMO

## 2017-11-11 ENCOUNTER — Telehealth: Payer: Self-pay | Admitting: Hematology and Oncology

## 2017-11-11 NOTE — Telephone Encounter (Signed)
Patient came by scheduling to advise [place] that her CT had been cancelled due to No Auth.  Per [facility] .  I talked with Dr Clydene Laming nurse Erasmo Downer and she stated that Dr Lebron Conners was going to order [place] and we would be in touch with date/time.  She was very Patent attorney.

## 2017-11-14 ENCOUNTER — Ambulatory Visit (HOSPITAL_COMMUNITY)
Admission: RE | Admit: 2017-11-14 | Discharge: 2017-11-14 | Disposition: A | Discharge status: Home / Self Care | Payer: Medicare HMO | Source: Ambulatory Visit | Attending: Hematology and Oncology | Admitting: Hematology and Oncology

## 2017-11-14 ENCOUNTER — Other Ambulatory Visit: Payer: Self-pay | Admitting: Hematology and Oncology

## 2017-11-14 DIAGNOSIS — D751 Secondary polycythemia: Secondary | ICD-10-CM | POA: Diagnosis not present

## 2017-11-14 DIAGNOSIS — J988 Other specified respiratory disorders: Secondary | ICD-10-CM | POA: Insufficient documentation

## 2017-11-14 LAB — PULMONARY FUNCTION TEST
DL/VA % pred: 110 %
DL/VA: 5.17 ml/min/mmHg/L
DLCO UNC: 22.54 ml/min/mmHg
DLCO cor % pred: 90 %
DLCO cor: 20.75 ml/min/mmHg
DLCO unc % pred: 98 %
FEF 25-75 Post: 1.72 L/sec
FEF 25-75 Pre: 1.48 L/sec
FEF2575-%Change-Post: 16 %
FEF2575-%Pred-Post: 96 %
FEF2575-%Pred-Pre: 83 %
FEV1-%CHANGE-POST: 3 %
FEV1-%PRED-POST: 95 %
FEV1-%Pred-Pre: 92 %
FEV1-PRE: 1.96 L
FEV1-Post: 2.02 L
FEV1FVC-%Change-Post: 4 %
FEV1FVC-%Pred-Pre: 100 %
FEV6-%Change-Post: -1 %
FEV6-%PRED-POST: 93 %
FEV6-%Pred-Pre: 95 %
FEV6-PRE: 2.57 L
FEV6-Post: 2.53 L
FEV6FVC-%CHANGE-POST: 0 %
FEV6FVC-%PRED-POST: 104 %
FEV6FVC-%Pred-Pre: 105 %
FVC-%CHANGE-POST: -1 %
FVC-%Pred-Post: 89 %
FVC-%Pred-Pre: 90 %
FVC-Post: 2.54 L
FVC-Pre: 2.57 L
POST FEV6/FVC RATIO: 100 %
PRE FEV6/FVC RATIO: 100 %
Post FEV1/FVC ratio: 80 %
Pre FEV1/FVC ratio: 76 %
RV % pred: 115 %
RV: 2.5 L
TLC % pred: 104 %
TLC: 5.12 L

## 2017-11-14 MED ORDER — ALBUTEROL SULFATE (2.5 MG/3ML) 0.083% IN NEBU
2.5000 mg | INHALATION_SOLUTION | Freq: Once | RESPIRATORY_TRACT | Status: AC
  Administered 2017-11-14: 2.5 mg via RESPIRATORY_TRACT

## 2017-11-19 ENCOUNTER — Ambulatory Visit (HOSPITAL_COMMUNITY)
Admission: RE | Admit: 2017-11-19 | Discharge: 2017-11-19 | Disposition: A | Discharge status: Home / Self Care | Payer: Medicare HMO | Source: Ambulatory Visit | Attending: Hematology and Oncology | Admitting: Hematology and Oncology

## 2017-11-19 DIAGNOSIS — K8020 Calculus of gallbladder without cholecystitis without obstruction: Secondary | ICD-10-CM | POA: Insufficient documentation

## 2017-11-19 DIAGNOSIS — D751 Secondary polycythemia: Secondary | ICD-10-CM | POA: Diagnosis not present

## 2017-12-25 ENCOUNTER — Telehealth: Payer: Self-pay | Admitting: Hematology and Oncology

## 2017-12-25 NOTE — Telephone Encounter (Signed)
Appointments for 9/27 cancelled per MP patient will return to primary. MP will facilitate return. Fyi message sent to MP confirming cancellation,

## 2018-01-07 ENCOUNTER — Telehealth: Payer: Self-pay

## 2018-01-07 NOTE — Telephone Encounter (Signed)
Called PCP office [phone] to leave message for Dr. Carol Ada regarding change of POC for pt. Request by Dr. Lebron Conners for pt to f/u every 3-6 months with PCP to check and review CBC/differential. If Hct exceeds 55%, pt to be referred back to [facility] with Dr. Irene Limbo or another physician for polycythemia management. Message given to Network engineer, Edwena Felty. Desk nurse number provided [phone] if any questions or concerns regarding plan for f/u.

## 2018-01-07 NOTE — Telephone Encounter (Signed)
LVM on pt phone to let her know about plan to f/u with PCP.

## 2018-04-13 ENCOUNTER — Other Ambulatory Visit: Payer: Self-pay | Admitting: Family Medicine

## 2018-04-13 DIAGNOSIS — Z1231 Encounter for screening mammogram for malignant neoplasm of breast: Secondary | ICD-10-CM

## 2018-04-27 ENCOUNTER — Ambulatory Visit
Admission: RE | Admit: 2018-04-27 | Discharge: 2018-04-27 | Disposition: A | Discharge status: Home / Self Care | Payer: Medicare HMO | Source: Ambulatory Visit | Attending: Family Medicine | Admitting: Family Medicine

## 2018-04-27 DIAGNOSIS — Z1231 Encounter for screening mammogram for malignant neoplasm of breast: Secondary | ICD-10-CM

## 2018-05-01 ENCOUNTER — Ambulatory Visit: Payer: Medicare HMO | Admitting: Hematology and Oncology

## 2018-05-01 ENCOUNTER — Other Ambulatory Visit: Payer: Medicare HMO

## 2018-05-26 DIAGNOSIS — D582 Other hemoglobinopathies: Secondary | ICD-10-CM | POA: Diagnosis not present

## 2018-05-26 DIAGNOSIS — R69 Illness, unspecified: Secondary | ICD-10-CM | POA: Diagnosis not present

## 2018-05-26 DIAGNOSIS — E7800 Pure hypercholesterolemia, unspecified: Secondary | ICD-10-CM | POA: Diagnosis not present

## 2018-05-26 DIAGNOSIS — K219 Gastro-esophageal reflux disease without esophagitis: Secondary | ICD-10-CM | POA: Diagnosis not present

## 2018-06-01 DIAGNOSIS — Z85828 Personal history of other malignant neoplasm of skin: Secondary | ICD-10-CM | POA: Diagnosis not present

## 2018-06-01 DIAGNOSIS — L821 Other seborrheic keratosis: Secondary | ICD-10-CM | POA: Diagnosis not present

## 2018-06-01 DIAGNOSIS — D1801 Hemangioma of skin and subcutaneous tissue: Secondary | ICD-10-CM | POA: Diagnosis not present

## 2018-06-01 DIAGNOSIS — L820 Inflamed seborrheic keratosis: Secondary | ICD-10-CM | POA: Diagnosis not present

## 2018-06-01 DIAGNOSIS — D2262 Melanocytic nevi of left upper limb, including shoulder: Secondary | ICD-10-CM | POA: Diagnosis not present

## 2018-06-01 DIAGNOSIS — D2271 Melanocytic nevi of right lower limb, including hip: Secondary | ICD-10-CM | POA: Diagnosis not present

## 2018-07-30 DIAGNOSIS — M25561 Pain in right knee: Secondary | ICD-10-CM | POA: Diagnosis not present

## 2018-07-30 DIAGNOSIS — W19XXXA Unspecified fall, initial encounter: Secondary | ICD-10-CM | POA: Diagnosis not present

## 2018-08-06 DIAGNOSIS — M25561 Pain in right knee: Secondary | ICD-10-CM | POA: Diagnosis not present

## 2018-08-06 DIAGNOSIS — M25562 Pain in left knee: Secondary | ICD-10-CM | POA: Diagnosis not present

## 2018-09-09 DIAGNOSIS — K219 Gastro-esophageal reflux disease without esophagitis: Secondary | ICD-10-CM | POA: Diagnosis not present

## 2018-09-09 DIAGNOSIS — Z0000 Encounter for general adult medical examination without abnormal findings: Secondary | ICD-10-CM | POA: Diagnosis not present

## 2018-09-09 DIAGNOSIS — E2839 Other primary ovarian failure: Secondary | ICD-10-CM | POA: Diagnosis not present

## 2018-09-09 DIAGNOSIS — E7800 Pure hypercholesterolemia, unspecified: Secondary | ICD-10-CM | POA: Diagnosis not present

## 2018-09-09 DIAGNOSIS — Z1389 Encounter for screening for other disorder: Secondary | ICD-10-CM | POA: Diagnosis not present

## 2018-09-10 ENCOUNTER — Other Ambulatory Visit: Payer: Self-pay | Admitting: Family Medicine

## 2018-09-10 DIAGNOSIS — E2839 Other primary ovarian failure: Secondary | ICD-10-CM

## 2018-10-16 DIAGNOSIS — H2513 Age-related nuclear cataract, bilateral: Secondary | ICD-10-CM | POA: Diagnosis not present

## 2018-10-16 DIAGNOSIS — H5213 Myopia, bilateral: Secondary | ICD-10-CM | POA: Diagnosis not present

## 2018-10-20 ENCOUNTER — Ambulatory Visit: Payer: Medicare HMO | Admitting: Sports Medicine

## 2018-10-20 ENCOUNTER — Other Ambulatory Visit: Payer: Self-pay

## 2018-10-20 VITALS — BP 128/88 | Temp 98.4°F | Ht 63.0 in | Wt 188.0 lb

## 2018-10-20 DIAGNOSIS — G8929 Other chronic pain: Secondary | ICD-10-CM | POA: Diagnosis not present

## 2018-10-20 DIAGNOSIS — M25562 Pain in left knee: Secondary | ICD-10-CM | POA: Diagnosis not present

## 2018-10-20 DIAGNOSIS — M25561 Pain in right knee: Secondary | ICD-10-CM

## 2018-10-20 MED ORDER — DICLOFENAC SODIUM 1 % TD GEL: 2.0000 g | Freq: Four times a day (QID) | TRANSDERMAL | 1 refills | Status: DC

## 2018-10-20 NOTE — Patient Instructions (Signed)
Thank you for coming see [place] today in clinic.  You were seen today for bilateral knee pain.  We believe this is secondary to underlying arthritis.  We will give you a topical cream today to help with pain relief.  We will also give you some home exercises to work on strengthening and stretching.  We will see back as needed in clinic.  If you do not get any better, we will bring you back to get standing x-rays of your knee.

## 2018-10-20 NOTE — Progress Notes (Signed)
HPI  CC: Bilateral knee pain  Kara [name] is a 73 year old female presents for bilateral knee pain.  She states the pain started back in October after she had a fall.  She states that her knees hurt until around the beginning of November.  She had some improvement till Thanksgiving.  She states she is on her feet the entire Thanksgiving, and had some pain afterwards.  She states this also improved, till Christmas when she was walking around shopping.  She states she had pain that was so severe that she went to urgent care after Christmas.  She says they obtained films at that time which showed no fracture but mild degenerative changes.  She was started on a prednisone taper which helped with the pain relief.  She states the pain is, lingered since that time.  She states is worse after she sits for prolonged period time and stands up.  She states is also worse going up and down stairs.  She states the pain does not bother her much at night.  She states that around Christmas she also had a limp.  This is since resolved.  She denies any locking or catching of her knee.  She denies giving out of her knee.  She denies any recent fevers or chills.  She denies any numbness and tingling going down her leg.  She does not have any history of trauma to the area.  Past Injuries: None Past Surgeries: None Smoking: Non-smoker Family Hx: Noncontributory  All past medical history, medications, and allergies reviewed myself at today's visit.  ROS: Per HPI; in addition no fever, no rash, no additional weakness, no additional numbness, no additional paresthesias, and no additional falls/injury.   Objective: BP 128/88   Temp 98.4 F (36.9 C)   Ht 5\' 3"  (1.6 m)   Wt 188 lb (85.3 kg)   BMI 33.30 kg/m  Gen: Right-Hand Dominant. NAD, well groomed, a/o x3, normal affect.  CV: Well-perfused. Warm.  Resp: Non-labored.  Neuro: Sensation intact throughout. No gross coordination deficits.  Gait: Nonpathologic posture,  unremarkable stride without signs of limp or balance issues.  Bilateral knee exam: No erythema, warmth, swelling noted.  Right side valgus deformity.  Tenderness palpation of the medial joint line of the right knee.  Tenderness palpation over the peds anserine bursa on the right side.  No tenderness palpation of the left knee.  Full range of motion in extension and flexion of both sides.  Strength out of 5 throughout testing.  Negative Lockman, negative posterior drawer, negative varus stress testing, negative valgus stress testing, negative McMurray test bilaterally.  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  We discussed treatment options at today's visit.  At this time we will start her with home exercises for quad strengthening and stretching.  We did discuss if she has no improvement, will consider starting formal physical therapy.  We will also start her on Voltaren cream at today's visit.  She can take NSAIDs as needed by mouth for pain relief.  We discussed if the pain returns or gets worse, we can use a steroid injection to help acutely.  At the pain does return, I would also like to obtain new x-rays with weightbearing films.  We will see her back as needed.  Lewanda Rife, MD [facility] Fellow 10/20/2018 4:33 PM  Patient seen and evaluated with the sports medicine fellow.  I agree with the above  plan of care.  Treatment as above.  If symptoms persist, I would start with getting a weightbearing x-ray of her knee.  Follow-up as needed.

## 2018-10-21 ENCOUNTER — Encounter: Payer: Self-pay | Admitting: Sports Medicine

## 2018-12-08 ENCOUNTER — Other Ambulatory Visit: Payer: Self-pay | Admitting: Sports Medicine

## 2019-05-25 ENCOUNTER — Telehealth: Payer: Self-pay

## 2019-05-25 NOTE — Telephone Encounter (Signed)
Called and scheduled new patient appointment for Thursday.

## 2019-05-27 ENCOUNTER — Ambulatory Visit: Payer: Self-pay

## 2019-05-27 ENCOUNTER — Ambulatory Visit (INDEPENDENT_AMBULATORY_CARE_PROVIDER_SITE_OTHER)
Admission: RE | Admit: 2019-05-27 | Discharge: 2019-05-27 | Disposition: A | Discharge status: Home / Self Care | Payer: Medicare HMO | Source: Ambulatory Visit | Attending: Family Medicine | Admitting: Family Medicine

## 2019-05-27 ENCOUNTER — Ambulatory Visit: Payer: Medicare HMO | Admitting: Family Medicine

## 2019-05-27 ENCOUNTER — Encounter: Payer: Self-pay | Admitting: Family Medicine

## 2019-05-27 ENCOUNTER — Other Ambulatory Visit: Payer: Self-pay

## 2019-05-27 VITALS — BP 124/78 | HR 83 | Ht 63.0 in | Wt 177.2 lb

## 2019-05-27 DIAGNOSIS — M25512 Pain in left shoulder: Secondary | ICD-10-CM | POA: Diagnosis not present

## 2019-05-27 DIAGNOSIS — G8929 Other chronic pain: Secondary | ICD-10-CM

## 2019-05-27 DIAGNOSIS — M19012 Primary osteoarthritis, left shoulder: Secondary | ICD-10-CM | POA: Diagnosis not present

## 2019-05-27 NOTE — Progress Notes (Signed)
[facility] [place], [place] [address] Phone: [phone] Subjective:    I'm seeing this patient by the request  of:  Carol Ada, MD    CC: L shoulder pain  [id number]    I, Wendy Poet, LAT, ATC, am serving as scribe for Dr. Hulan Saas.  Kara [name] is a 73 y.o. female coming in with complaint of L shoulder pain x 6-8 weeks w/ no known MOI.  Pt has pain w/ attempts at ext and horizontal aBd and pain at end-range flexion and aBd.  She is having pain all along the L shoulder and upper arm.  Pt rates her pain at a 10/10 when it "catches her" but has no pain at rest.  Pt denies neck pain and denies N/T into her L UE or L shoulder mechanical symptoms.  Pt has tried Aspercreme, arnica gel, Tylenol arthritis w/ little relief.      Past Medical History:  Diagnosis Date  . Acid reflux   . GERD (gastroesophageal reflux disease)   . Hypercholesteremia   . Hyperlipidemia    Past Surgical History:  Procedure Laterality Date  . HERNIA REPAIR     Social History   Socioeconomic History  . Marital status: Married    Spouse name: Not on file  . Number of children: Not on file  . Years of education: Not on file  . Highest education level: Not on file  Occupational History  . Not on file  Social Needs  . Financial resource strain: Not on file  . Food insecurity    Worry: Not on file    Inability: Not on file  . Transportation needs    Medical: Not on file    Non-medical: Not on file  Tobacco Use  . Smoking status: Never Smoker  . Smokeless tobacco: Never Used  Substance and Sexual Activity  . Alcohol use: Yes    Comment: socially  . Drug use: No  . Sexual activity: Not on file  Lifestyle  . Physical activity    Days per week: Not on file    Minutes per session: Not on file  . Stress: Not on file  Relationships  . Social Herbalist on phone: Not on file    Gets together: Not on file    Attends  religious service: Not on file    Active member of club or organization: Not on file    Attends meetings of clubs or organizations: Not on file    Relationship status: Not on file  Other Topics Concern  . Not on file  Social History Narrative  . Not on file   Allergies  Allergen Reactions  . Amoxicillin Hives  . Penicillins Hives   No family history on file.   Current Outpatient Medications (Cardiovascular):  .  rosuvastatin (CRESTOR) 20 MG tablet, Take 20 mg by mouth daily.     Current Outpatient Medications (Other):  Marland Kitchen  ALPRAZolam (XANAX) 0.5 MG tablet, Take 0.5 mg by mouth daily as needed for anxiety. Marland Kitchen  omeprazole (PRILOSEC) 20 MG capsule, Take 20 mg by mouth daily. Marland Kitchen  tetrahydrozoline (VISINE) 0.05 % ophthalmic solution, Place 1 drop into both eyes 4 (four) times daily as needed (for dry/irritated eyes). Marland Kitchen  diclofenac sodium (VOLTAREN) 1 % GEL, APPLY 2 GRAMS TOPICALLY 4 TIMES A DAY (Patient not taking: Reported on 05/27/2019)    Past medical history, social, surgical and family history all reviewed in  electronic medical record.  No pertanent information unless stated regarding to the chief complaint.   Review of Systems:  No headache, visual changes, nausea, vomiting, diarrhea, constipation, dizziness, abdominal pain, skin rash, fevers, chills, night sweats, weight loss, swollen lymph nodes, body aches, joint swelling, , chest pain, shortness of breath, mood changes.  Positive muscle aches  Objective  Blood pressure 124/78, pulse 83, height 5\' 3"  (1.6 m), weight 177 lb 3.2 oz (80.4 kg), SpO2 97 %.    General: No apparent distress alert and oriented x3 mood and affect normal, dressed appropriately.  HEENT: Pupils equal, extraocular movements intact  Respiratory: Patient's speak in full sentences and does not appear short of breath  Cardiovascular: No lower extremity edema, non tender, no erythema  Skin: Warm dry intact with no signs of infection or rash on extremities  or on axial skeleton.  Abdomen: Soft nontender  Neuro: Cranial nerves II through XII are intact, neurovascularly intact in all extremities with 2+ DTRs and 2+ pulses.  Lymph: No lymphadenopathy of posterior or anterior cervical chain or axillae bilaterally.  Gait normal with good balance and coordination.  MSK:  Non tender with full range of motion and good stability and symmetric strength and tone of , elbows, wrist, hip, knee and ankles bilaterally.  Left shoulder exam does have something that seems to be more like a lipoma on the anterior deltoid.  Possible varicose vessel noted.  And freely movable.  Shoulder and does have some mild atrophy.  Positive impingement.  Some arthritic changes with crepitus noted.  Limited musculoskeletal ultrasound was performed and interpreted by Lyndal Pulley  Limited musculoskeletal ultrasound shows the patient does have some arthritic changes of the acromioclavicular and the glenohumeral joint, some mild calcific changes of the posterior labrum also noted.  Patient unfortunately unable to see the bicep tendon at this moment. Impression: Rotator cuff   Impression and Recommendations:     This case required medical decision making of moderate complexity. The above documentation has been reviewed and is accurate and complete Lyndal Pulley, DO       Note: This dictation was prepared with Dragon dictation along with smaller phrase technology. Any transcriptional errors that result from this process are unintentional.

## 2019-05-27 NOTE — Patient Instructions (Addendum)
Good to see you.   Exercises 3 times a week.   pennsaid pinkie amount topically 2 times daily as needed.   Tart cherry extract 1200mg  at night Vitamin D 2000 IU daily   Arm compression sleeve  X-ray of L shoulder downstairs today before you leave  See me again in 5-6 weeks

## 2019-05-27 NOTE — Assessment & Plan Note (Signed)
Left shoulder pain, seems to be more on chronic pain acute.  Mild decreased range of motion, I do believe there is some underlying osteoarthritic change to be contributing.  Rotator cuff appears to be intact but does have some mild weakness on exam.  Bicep tendon likely had a tear noted.  Discussed which activities to do which wants to avoid.  Patient will increase activity as tolerated.  Follow-up again in 4 to 8 weeks

## 2019-05-31 ENCOUNTER — Other Ambulatory Visit: Payer: Self-pay | Admitting: Family Medicine

## 2019-05-31 DIAGNOSIS — Z1231 Encounter for screening mammogram for malignant neoplasm of breast: Secondary | ICD-10-CM

## 2019-07-04 NOTE — Progress Notes (Signed)
[facility] [place] [address] [place], [place] [address] Phone: [phone] Subjective:   I Kara [name] am serving as a Education administrator for Dr. Hulan Saas.   CC: Left shoulder pain follow-up  [id number]   05/27/2019 Left shoulder pain, seems to be more on chronic pain acute.  Mild decreased range of motion, I do believe there is some underlying osteoarthritic change to be contributing.  Rotator cuff appears to be intact but does have some mild weakness on exam.  Bicep tendon likely had a tear noted.  Discussed which activities to do which wants to avoid.  Patient will increase activity as tolerated.  Follow-up again in 4 to 8 weeks  Update 07/05/2019 Kara [name] is a 73 y.o. female coming in with complaint of left shoulder pain. Patient states she is feeling much better.  Patient states that the exercises, icing, and vitamins seem to all be helping.  Would state about 80 to 90% better.   X-rays from patient's shoulder May 27, 2019 were independently visualized by me showing mild acromioclavicular arthritis and glenohumeral arthritis.  Degenerative changes of the rotator cuff noted.  On ultrasound.  Past Medical History:  Diagnosis Date  . Acid reflux   . GERD (gastroesophageal reflux disease)   . Hypercholesteremia   . Hyperlipidemia    Past Surgical History:  Procedure Laterality Date  . HERNIA REPAIR     Social History   Socioeconomic History  . Marital status: Married    Spouse name: Not on file  . Number of children: Not on file  . Years of education: Not on file  . Highest education level: Not on file  Occupational History  . Not on file  Social Needs  . Financial resource strain: Not on file  . Food insecurity    Worry: Not on file    Inability: Not on file  . Transportation needs    Medical: Not on file    Non-medical: Not on file  Tobacco Use  . Smoking status: Never Smoker  . Smokeless tobacco: Never Used  Substance and  Sexual Activity  . Alcohol use: Yes    Comment: socially  . Drug use: No  . Sexual activity: Not on file  Lifestyle  . Physical activity    Days per week: Not on file    Minutes per session: Not on file  . Stress: Not on file  Relationships  . Social Herbalist on phone: Not on file    Gets together: Not on file    Attends religious service: Not on file    Active member of club or organization: Not on file    Attends meetings of clubs or organizations: Not on file    Relationship status: Not on file  Other Topics Concern  . Not on file  Social History Narrative  . Not on file   Allergies  Allergen Reactions  . Amoxicillin Hives  . Penicillins Hives   No family history on file.   Current Outpatient Medications (Cardiovascular):  .  rosuvastatin (CRESTOR) 20 MG tablet, Take 20 mg by mouth daily.     Current Outpatient Medications (Other):  Marland Kitchen  ALPRAZolam (XANAX) 0.5 MG tablet, Take 0.5 mg by mouth daily as needed for anxiety. .  diclofenac sodium (VOLTAREN) 1 % GEL, APPLY 2 GRAMS TOPICALLY 4 TIMES A DAY .  omeprazole (PRILOSEC) 20 MG capsule, Take 20 mg by mouth daily. Marland Kitchen  tetrahydrozoline (VISINE) 0.05 % ophthalmic  solution, Place 1 drop into both eyes 4 (four) times daily as needed (for dry/irritated eyes).    Past medical history, social, surgical and family history all reviewed in electronic medical record.  No pertanent information unless stated regarding to the chief complaint.   Review of Systems:  No headache, visual changes, nausea, vomiting, diarrhea, constipation, dizziness, abdominal pain, skin rash, fevers, chills, night sweats, weight loss, swollen lymph nodes, body aches, joint swelling,  chest pain, shortness of breath, mood changes.  Positive muscle aches  Objective  Blood pressure 140/76, pulse 63, height 5\' 3"  (1.6 m), weight 173 lb (78.5 kg), SpO2 98 %.   General: No apparent distress alert and oriented x3 mood and affect normal, dressed  appropriately.  HEENT: Pupils equal, extraocular movements intact  Respiratory: Patient's speak in full sentences and does not appear short of breath  Cardiovascular: No lower extremity edema, non tender, no erythema  Skin: Warm dry intact with no signs of infection or rash on extremities or on axial skeleton.  Abdomen: Soft nontender  Neuro: Cranial nerves II through XII are intact, neurovascularly intact in all extremities with 2+ DTRs and 2+ pulses.  Lymph: No lymphadenopathy of posterior or anterior cervical chain or axillae bilaterally.  Gait normal with good balance and coordination.  MSK:  tender with limited range of motion and good stability and symmetric strength and tone of  elbows, wrist, hip, knee and ankles bilaterally.  Left shoulder exam does have some mild impingement.  Motion 5 degrees in all planes.  Patient is doing much better at this moment.  No change in management.  We will continue to monitor, follow-up as needed   Impression and Recommendations:      The above documentation has been reviewed and is accurate and complete Lyndal Pulley, DO       Note: This dictation was prepared with Dragon dictation along with smaller phrase technology. Any transcriptional errors that result from this process are unintentional.

## 2019-07-05 ENCOUNTER — Other Ambulatory Visit: Payer: Self-pay

## 2019-07-05 ENCOUNTER — Ambulatory Visit (INDEPENDENT_AMBULATORY_CARE_PROVIDER_SITE_OTHER): Payer: Medicare HMO | Admitting: Family Medicine

## 2019-07-05 ENCOUNTER — Encounter: Payer: Self-pay | Admitting: Family Medicine

## 2019-07-05 DIAGNOSIS — G8929 Other chronic pain: Secondary | ICD-10-CM | POA: Diagnosis not present

## 2019-07-05 DIAGNOSIS — M25512 Pain in left shoulder: Secondary | ICD-10-CM

## 2019-07-05 NOTE — Assessment & Plan Note (Signed)
Patient doing really well.  Continue conservative therapy, discussed icing regimen and home exercise, follow-up on his as needed

## 2019-07-15 ENCOUNTER — Ambulatory Visit: Payer: Medicare HMO

## 2019-07-22 DIAGNOSIS — Z20828 Contact with and (suspected) exposure to other viral communicable diseases: Secondary | ICD-10-CM | POA: Diagnosis not present

## 2019-08-16 DIAGNOSIS — Z85828 Personal history of other malignant neoplasm of skin: Secondary | ICD-10-CM | POA: Diagnosis not present

## 2019-08-16 DIAGNOSIS — L821 Other seborrheic keratosis: Secondary | ICD-10-CM | POA: Diagnosis not present

## 2019-08-16 DIAGNOSIS — L819 Disorder of pigmentation, unspecified: Secondary | ICD-10-CM | POA: Diagnosis not present

## 2019-08-16 DIAGNOSIS — D1801 Hemangioma of skin and subcutaneous tissue: Secondary | ICD-10-CM | POA: Diagnosis not present

## 2019-08-16 DIAGNOSIS — D2262 Melanocytic nevi of left upper limb, including shoulder: Secondary | ICD-10-CM | POA: Diagnosis not present

## 2019-08-16 DIAGNOSIS — L814 Other melanin hyperpigmentation: Secondary | ICD-10-CM | POA: Diagnosis not present

## 2019-09-01 ENCOUNTER — Ambulatory Visit: Payer: Medicare HMO

## 2019-09-02 ENCOUNTER — Ambulatory Visit
Admission: RE | Admit: 2019-09-02 | Discharge: 2019-09-02 | Disposition: A | Discharge status: Home / Self Care | Payer: Medicare HMO | Source: Ambulatory Visit | Attending: Family Medicine | Admitting: Family Medicine

## 2019-09-02 ENCOUNTER — Other Ambulatory Visit: Payer: Self-pay

## 2019-09-02 DIAGNOSIS — Z1231 Encounter for screening mammogram for malignant neoplasm of breast: Secondary | ICD-10-CM

## 2019-09-10 ENCOUNTER — Ambulatory Visit: Payer: Medicare HMO | Attending: Internal Medicine

## 2019-09-10 DIAGNOSIS — Z23 Encounter for immunization: Secondary | ICD-10-CM | POA: Insufficient documentation

## 2019-09-10 NOTE — Progress Notes (Signed)
   Covid-19 Vaccination Clinic  Name:  Kara [name]    MRN: [id number] DOB: 1946-03-24  09/10/2019  [name] was observed post Covid-19 immunization for 30 minutes based on pre-vaccination screening without incidence. She was provided with Vaccine Information Sheet and instruction to access the V-Safe system.   [name] was instructed to call 911 with any severe reactions post vaccine: Marland Kitchen Difficulty breathing  . Swelling of your face and throat  . A fast heartbeat  . A bad rash all over your body  . Dizziness and weakness    Immunizations Administered    Name Date Dose VIS Date Route   Pfizer COVID-19 Vaccine 09/10/2019 12:06 PM 0.3 mL 07/16/2019 Intramuscular   Manufacturer: [place]   Lot: [id number]   [place]: [id number]

## 2019-09-23 DIAGNOSIS — R69 Illness, unspecified: Secondary | ICD-10-CM | POA: Diagnosis not present

## 2019-09-23 DIAGNOSIS — K219 Gastro-esophageal reflux disease without esophagitis: Secondary | ICD-10-CM | POA: Diagnosis not present

## 2019-09-23 DIAGNOSIS — Z0000 Encounter for general adult medical examination without abnormal findings: Secondary | ICD-10-CM | POA: Diagnosis not present

## 2019-09-23 DIAGNOSIS — Z1389 Encounter for screening for other disorder: Secondary | ICD-10-CM | POA: Diagnosis not present

## 2019-09-23 DIAGNOSIS — E7800 Pure hypercholesterolemia, unspecified: Secondary | ICD-10-CM | POA: Diagnosis not present

## 2019-10-05 ENCOUNTER — Ambulatory Visit: Payer: Medicare HMO | Attending: Internal Medicine

## 2019-10-05 DIAGNOSIS — Z23 Encounter for immunization: Secondary | ICD-10-CM

## 2019-10-05 NOTE — Progress Notes (Signed)
   Covid-19 Vaccination Clinic  Name:  Kara [name]    MRN: [id number] DOB: 1945-11-16  10/05/2019  [name] was observed post Covid-19 immunization for 30 minutes based on pre-vaccination screening without incident. She was provided with Vaccine Information Sheet and instruction to access the V-Safe system.   [name] was instructed to call 911 with any severe reactions post vaccine: Marland Kitchen Difficulty breathing  . Swelling of face and throat  . A fast heartbeat  . A bad rash all over body  . Dizziness and weakness   Immunizations Administered    Name Date Dose VIS Date Route   Pfizer COVID-19 Vaccine 10/05/2019 12:29 PM 0.3 mL 07/16/2019 Intramuscular   Manufacturer: [place]   Lot: [id number]   [place]: [id number]

## 2019-10-21 IMAGING — US US ABDOMEN COMPLETE
1 series · 14 of 25 positions shown · non-contrast
Comparison: None.

CLINICAL DATA: Polycythemia.  Evaluate for liver or renal lesions

EXAM:
ABDOMEN ULTRASOUND COMPLETE

[Series 1: us abdomen complete · 14 of 102 slices shown]
[im 1/102]
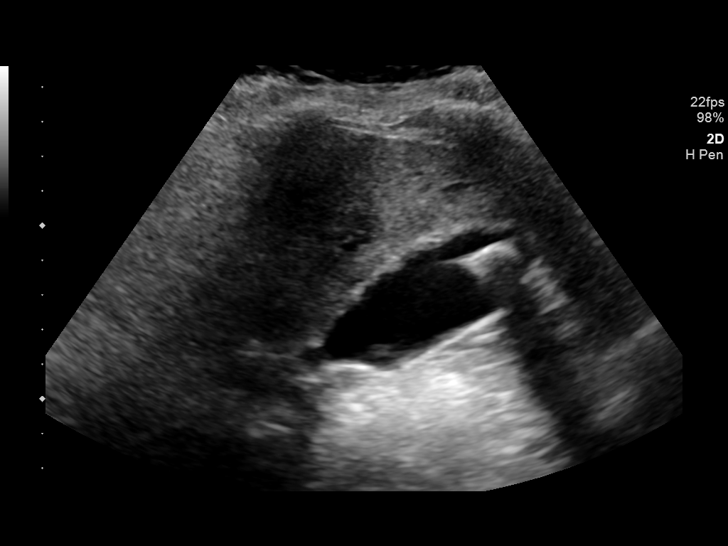
[im 9/102]
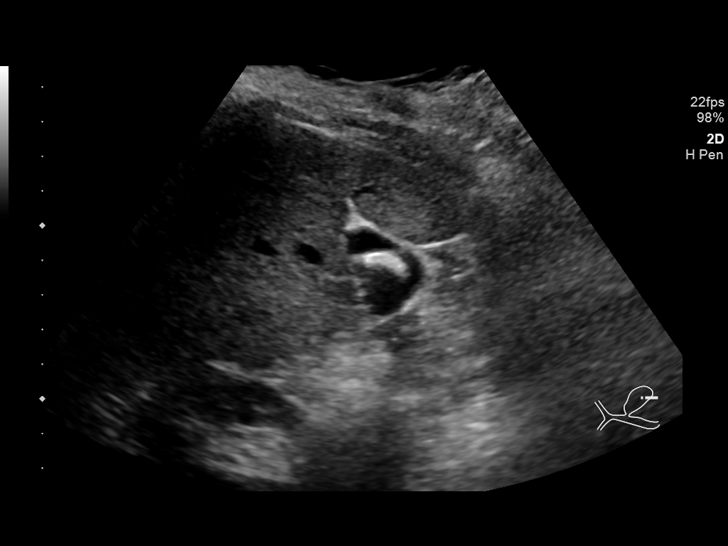
[im 17/102]
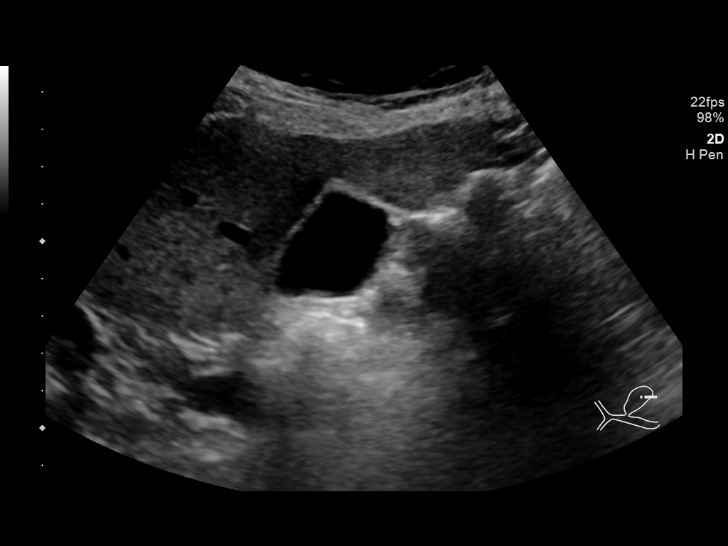
[im 26/102]
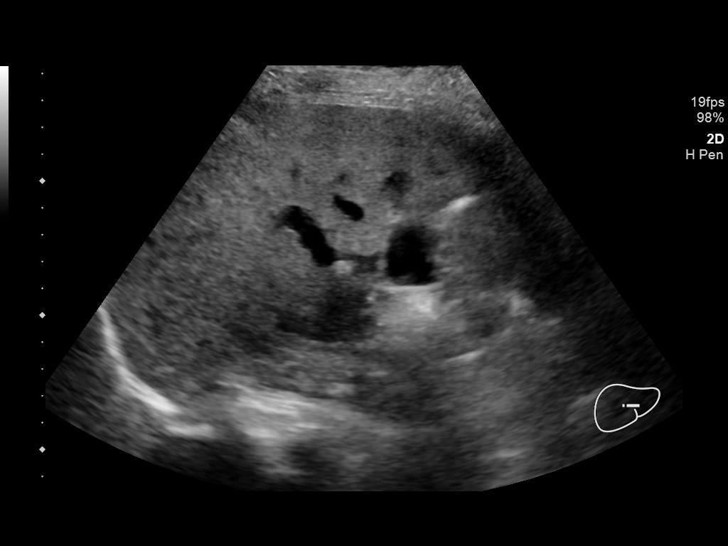
[im 34/102]
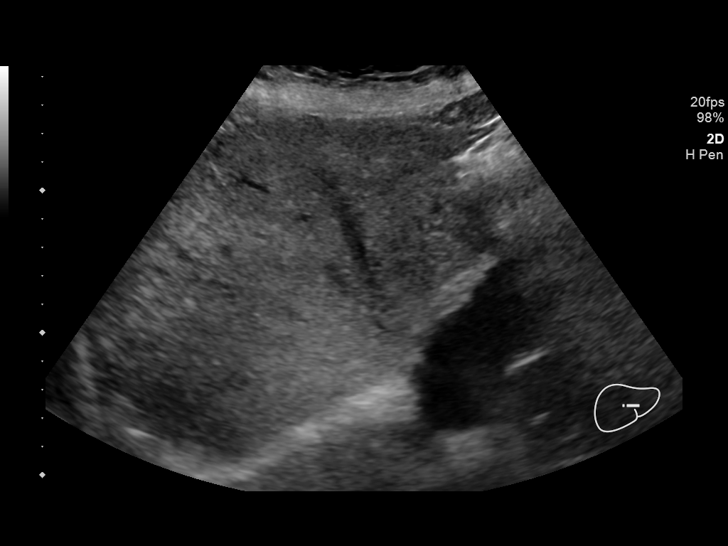
[im 38/102]
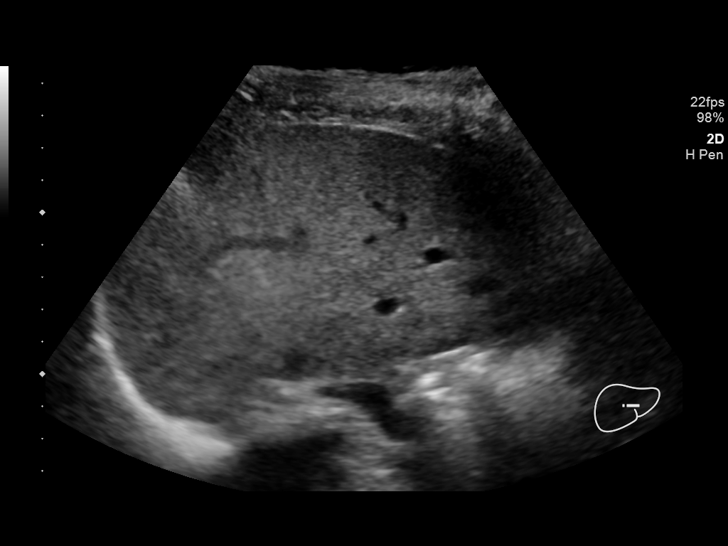
[im 47/102]
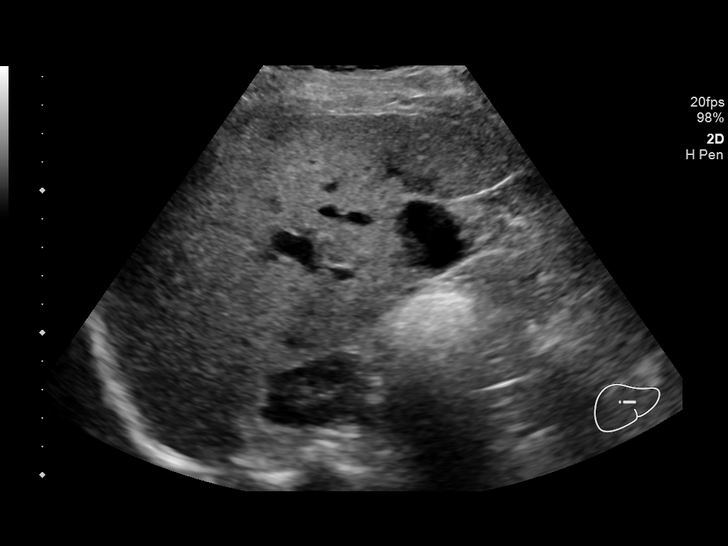
[im 55/102]
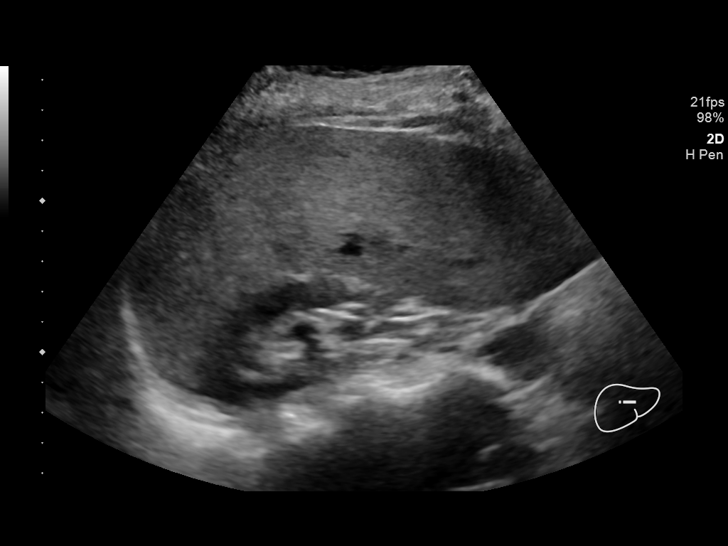
[im 64/102]
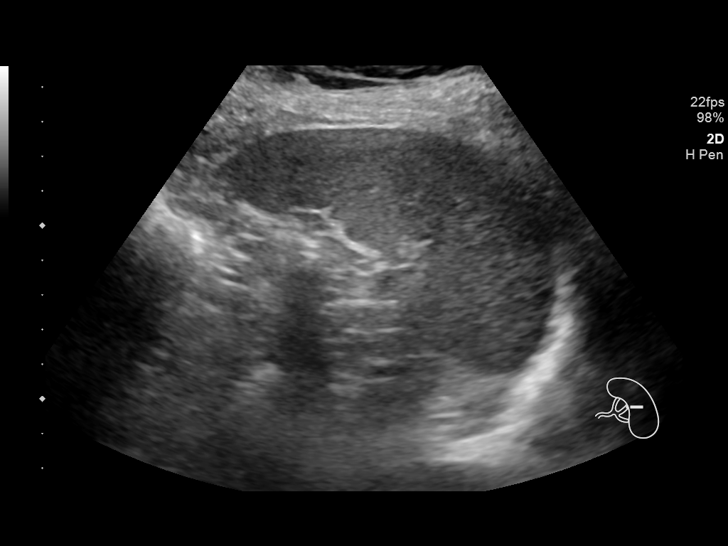
[im 68/102]
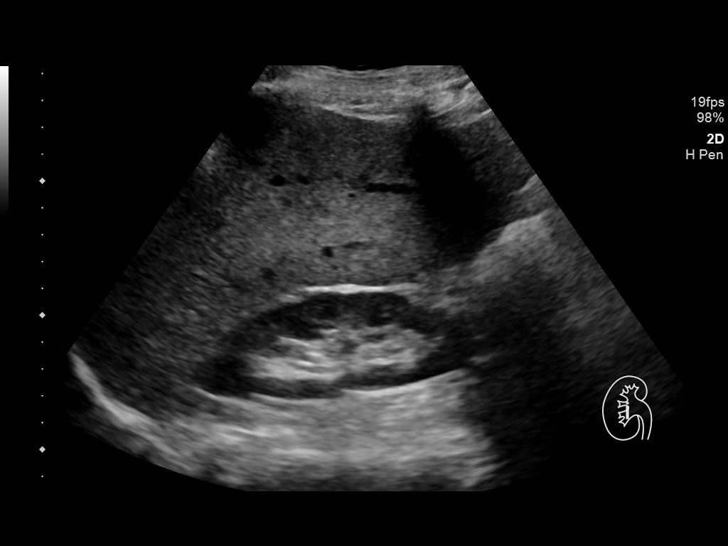
[im 76/102]
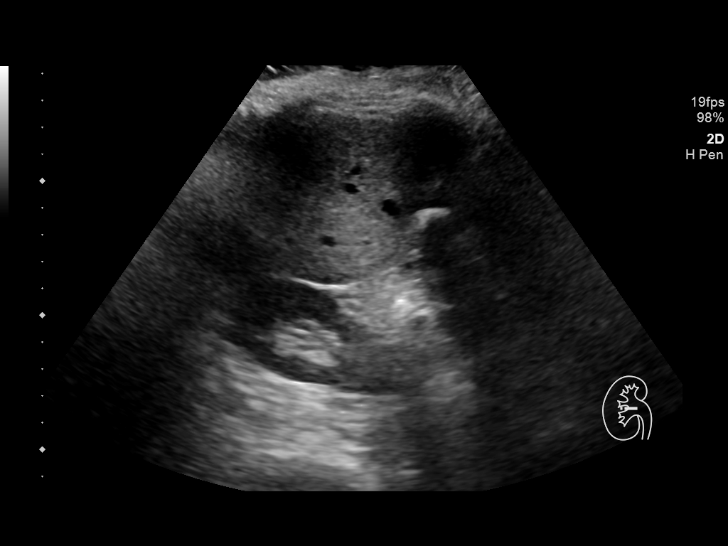
[im 85/102]
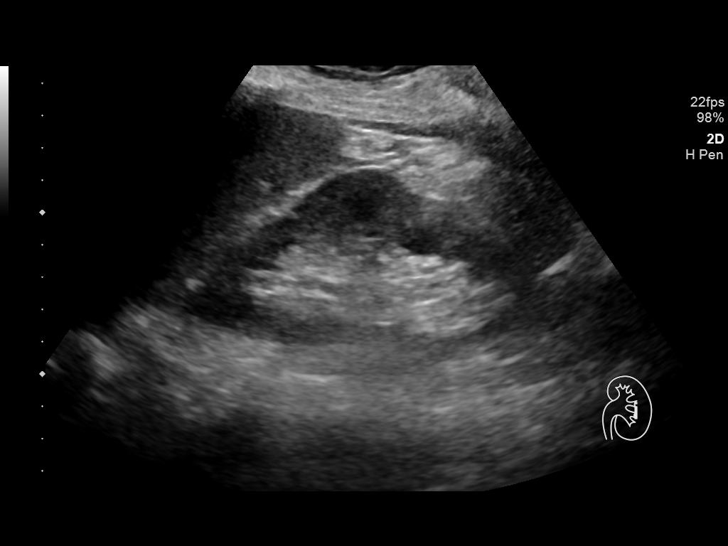
[im 93/102]
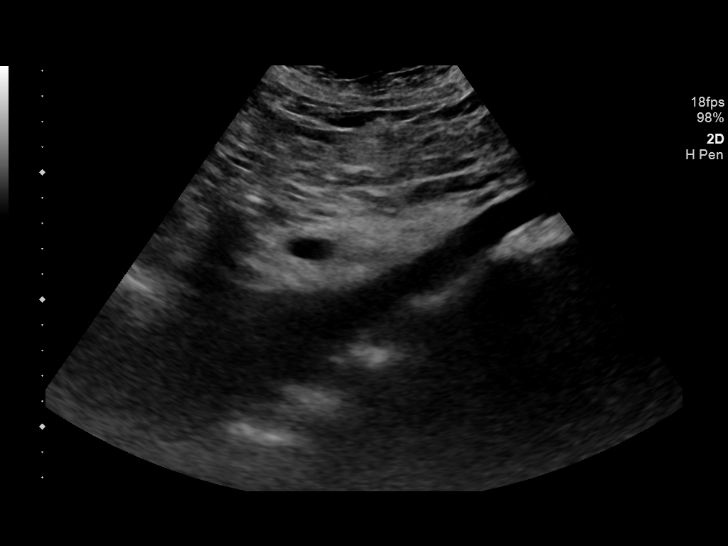
[im 102/102]
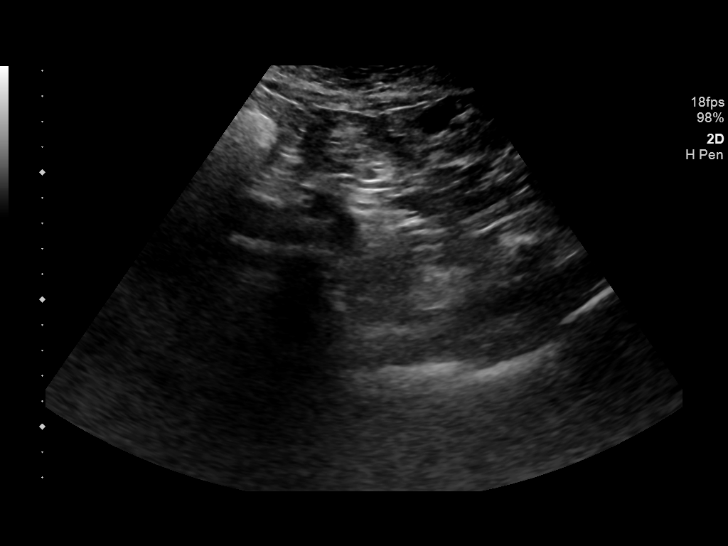

[14 of 25 positions shown; findings below may reference images not displayed]

FINDINGS: Gallbladder: Large shadowing gallstone that is mobile. No focal
tenderness or inflammatory changes.

Common bile duct: Diameter: 2 mm. Where visualized, no filling
defect.

Liver: No focal lesion identified. Prominent liver echogenicity but
no diminished penetration or sparing. Portal vein is patent on color
Doppler imaging with normal direction of blood flow towards the
liver.

IVC: No abnormality visualized.

Pancreas: Visualized portion unremarkable.

Spleen: Size and appearance within normal limits.

Right Kidney: Length: 10.4 cm. Echogenicity within normal limits. No
mass or hydronephrosis visualized.

Left Kidney: Length: 11 cm. Echogenicity within normal limits. No
mass or hydronephrosis visualized.

Abdominal aorta: No aneurysm visualized.
IMPRESSION: 1. Single mobile gallstone.
2. Otherwise negative.  No evidence of renal or liver mass.

## 2020-03-28 IMAGING — MG DIGITAL SCREENING BILATERAL MAMMOGRAM WITH TOMO AND CAD
8 series · 8 of 24 positions shown · non-contrast
Comparison: Previous exam(s).

CLINICAL DATA: Screening.

EXAM:
DIGITAL SCREENING BILATERAL MAMMOGRAM WITH TOMO AND CAD

[L CC synth-2D]
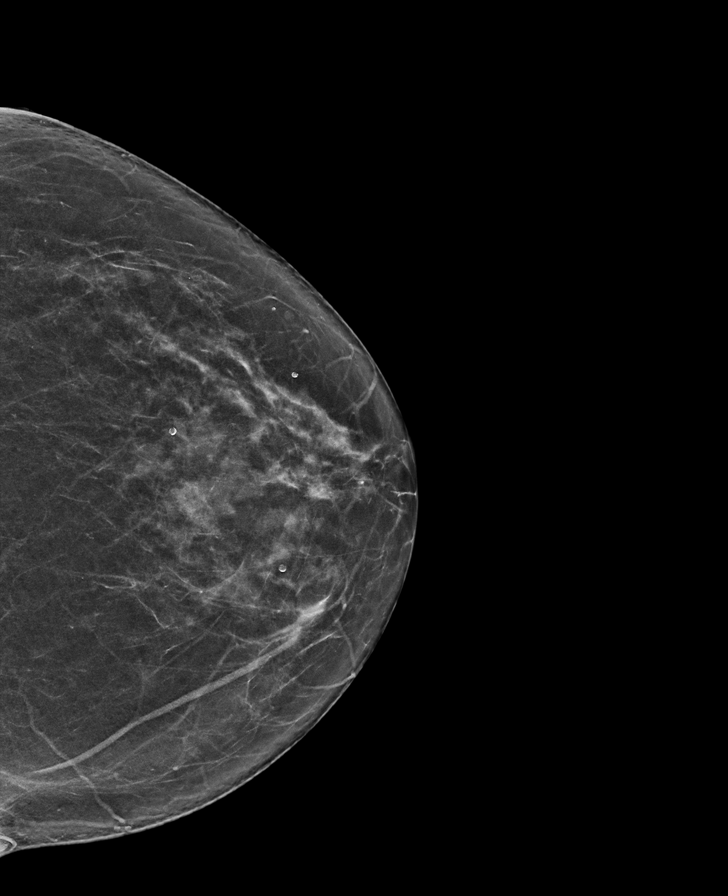

[R CC synth-2D]
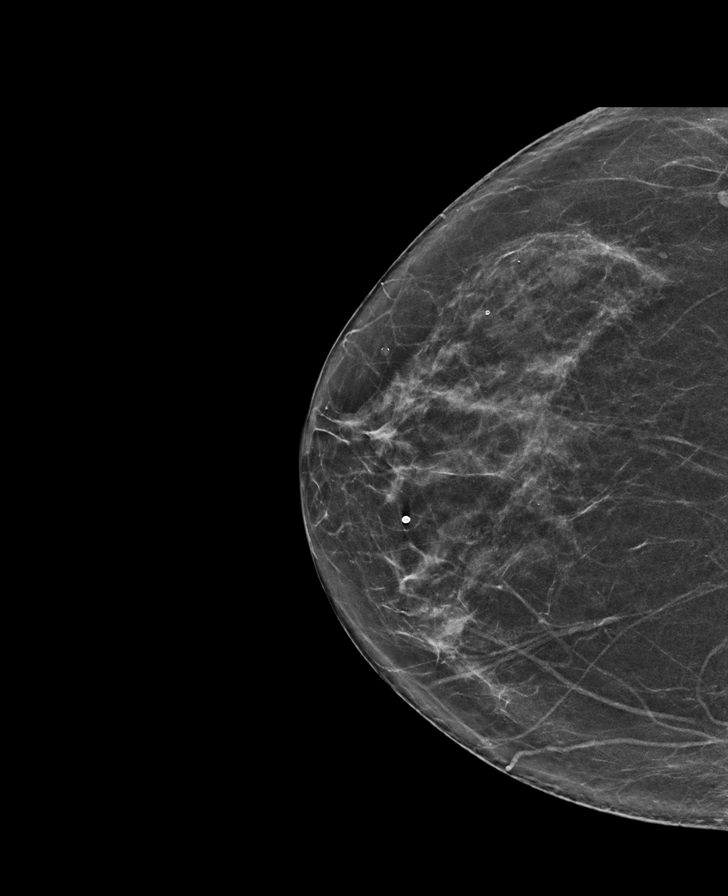

[R MLO synth-2D]
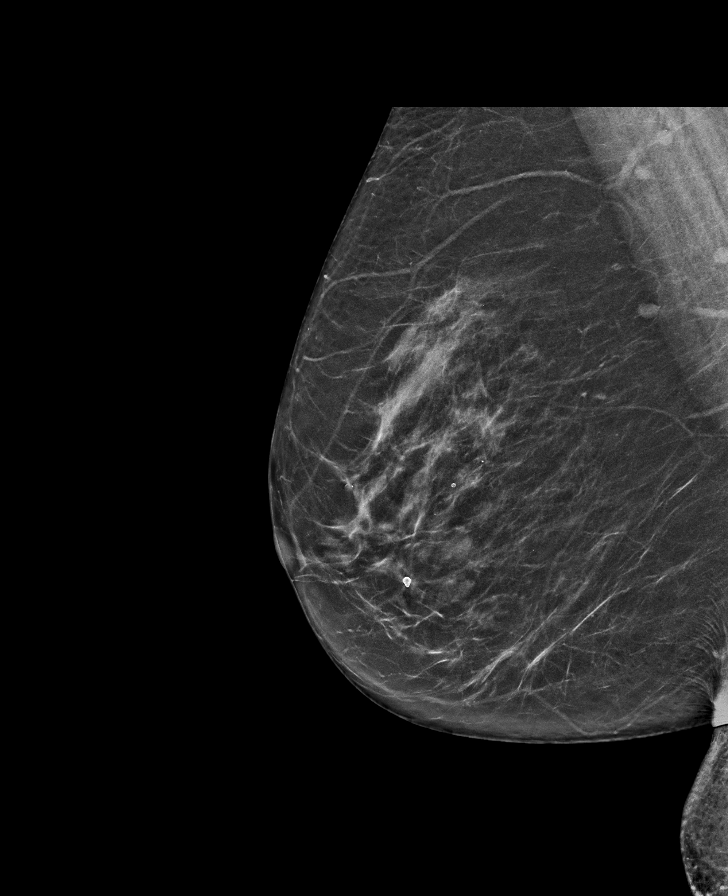

[L MLO synth-2D]
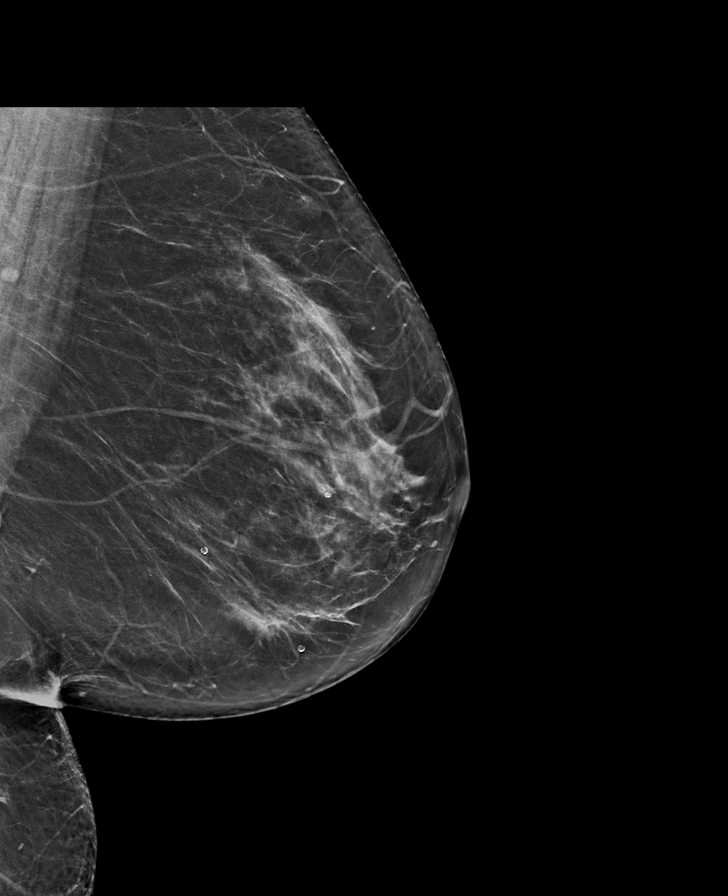

[L MLO tomo · tomo slice 37/74.0]
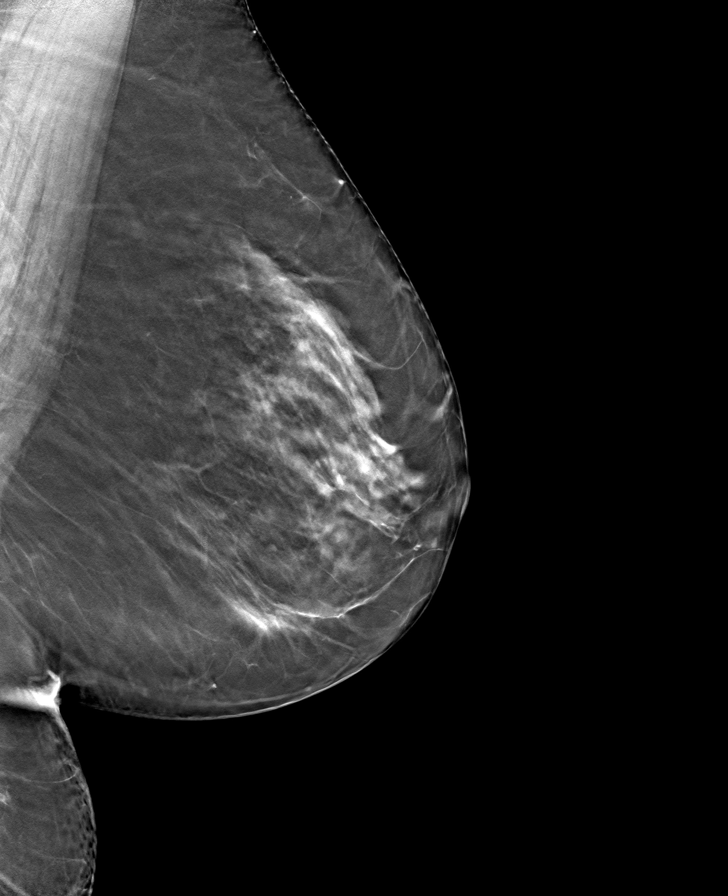

[R MLO tomo · tomo slice 37/73.0]
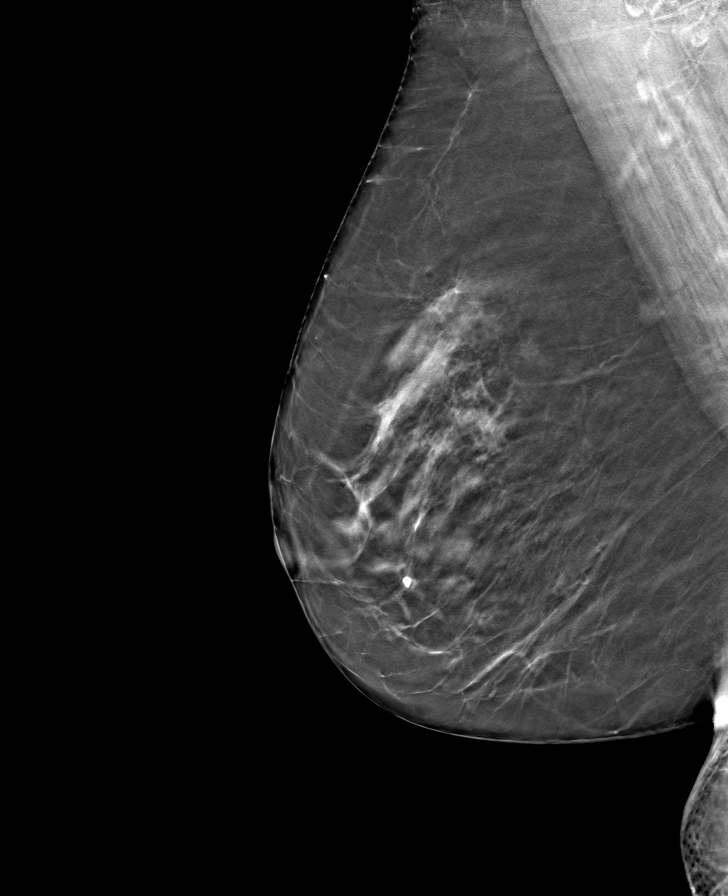

[L CC tomo · tomo slice 33/66.0]
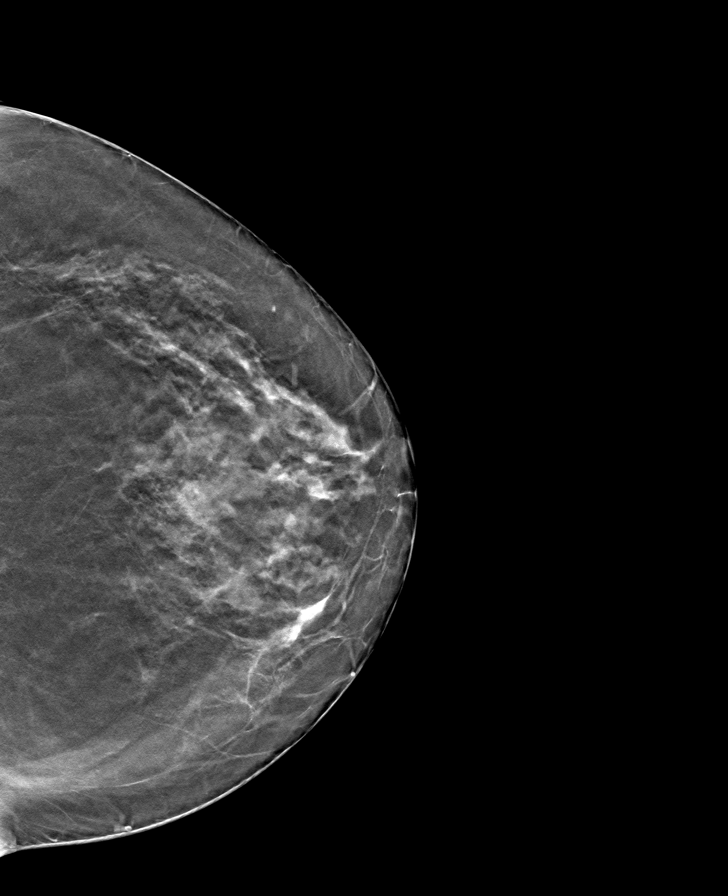

[R CC tomo · tomo slice 35/70.0]
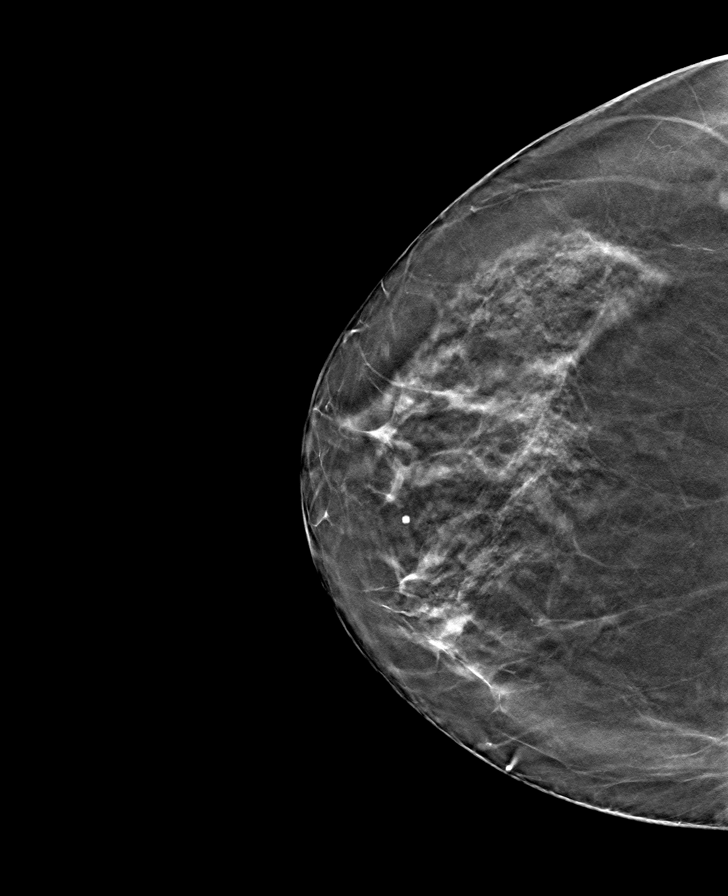

[8 of 24 positions shown; findings below may reference images not displayed]

ACR Breast Density Category c: The breast tissue is heterogeneously
dense, which may obscure small masses.
FINDINGS: There are no findings suspicious for malignancy. Images were
processed with CAD.
IMPRESSION: No mammographic evidence of malignancy. A result letter of this
screening mammogram will be mailed directly to the patient.

RECOMMENDATION:
Screening mammogram in one year. (Code:FT-U-LHB)

BI-RADS CATEGORY  1: Negative.

## 2020-05-12 DIAGNOSIS — R519 Headache, unspecified: Secondary | ICD-10-CM | POA: Diagnosis not present

## 2020-05-12 DIAGNOSIS — H9201 Otalgia, right ear: Secondary | ICD-10-CM | POA: Diagnosis not present

## 2020-05-12 DIAGNOSIS — R42 Dizziness and giddiness: Secondary | ICD-10-CM | POA: Diagnosis not present

## 2020-05-12 DIAGNOSIS — E7849 Other hyperlipidemia: Secondary | ICD-10-CM | POA: Diagnosis not present

## 2020-06-01 DIAGNOSIS — H938X1 Other specified disorders of right ear: Secondary | ICD-10-CM | POA: Diagnosis not present

## 2020-06-01 DIAGNOSIS — M62838 Other muscle spasm: Secondary | ICD-10-CM | POA: Diagnosis not present

## 2020-12-04 ENCOUNTER — Other Ambulatory Visit: Payer: Self-pay | Admitting: Family Medicine

## 2020-12-04 DIAGNOSIS — Z1231 Encounter for screening mammogram for malignant neoplasm of breast: Secondary | ICD-10-CM

## 2021-01-29 ENCOUNTER — Ambulatory Visit
Admission: RE | Admit: 2021-01-29 | Discharge: 2021-01-29 | Disposition: A | Discharge status: Home / Self Care | Payer: Medicare HMO | Source: Ambulatory Visit | Attending: Family Medicine | Admitting: Family Medicine

## 2021-01-29 ENCOUNTER — Other Ambulatory Visit: Payer: Self-pay

## 2021-01-29 DIAGNOSIS — Z1231 Encounter for screening mammogram for malignant neoplasm of breast: Secondary | ICD-10-CM

## 2021-02-08 DIAGNOSIS — D582 Other hemoglobinopathies: Secondary | ICD-10-CM | POA: Diagnosis not present

## 2021-02-08 DIAGNOSIS — K219 Gastro-esophageal reflux disease without esophagitis: Secondary | ICD-10-CM | POA: Diagnosis not present

## 2021-02-08 DIAGNOSIS — Z1159 Encounter for screening for other viral diseases: Secondary | ICD-10-CM | POA: Diagnosis not present

## 2021-02-08 DIAGNOSIS — Z1389 Encounter for screening for other disorder: Secondary | ICD-10-CM | POA: Diagnosis not present

## 2021-02-08 DIAGNOSIS — E7800 Pure hypercholesterolemia, unspecified: Secondary | ICD-10-CM | POA: Diagnosis not present

## 2021-02-08 DIAGNOSIS — R69 Illness, unspecified: Secondary | ICD-10-CM | POA: Diagnosis not present

## 2021-02-08 DIAGNOSIS — Z0000 Encounter for general adult medical examination without abnormal findings: Secondary | ICD-10-CM | POA: Diagnosis not present

## 2021-03-28 NOTE — Progress Notes (Signed)
[place] Phone: [phone] Subjective:   Kara [name], am serving as a scribe for Dr. Hulan Saas. This visit occurred during the SARS-CoV-2 public health emergency.  Safety protocols were in place, including screening questions prior to the visit, additional usage of staff PPE, and extensive cleaning of exam room while observing appropriate contact time as indicated for disinfecting solutions.   I'm seeing this patient by the request  of:  Carol Ada, MD  CC: Neck and shoulder pain  [id number]  Kara [name] is a 75 y.o. female coming in with complaint of neck and R shoulder pain. Seen in 2020 for L shoulder. Patient states that her pain has been occurring for past year. Pain with shoulder adduction and rotation of cervical spine. Started when she [place] on pillow at beach that was much higher than her pillow at home. Pain is radiating from R cervical spine into the R shoulder. Patient has had very little radiating pain.        Past Medical History:  Diagnosis Date   Acid reflux    GERD (gastroesophageal reflux disease)    Hypercholesteremia    Hyperlipidemia    Past Surgical History:  Procedure Laterality Date   HERNIA REPAIR     Social History   Socioeconomic History   Marital status: Married    Spouse name: Not on file   Number of children: Not on file   Years of education: Not on file   Highest education level: Not on file  Occupational History   Not on file  Tobacco Use   Smoking status: Never   Smokeless tobacco: Never  Vaping Use   Vaping Use: Never used  Substance and Sexual Activity   Alcohol use: Yes    Comment: socially   Drug use: [name]   Sexual activity: Not on file  Other Topics Concern   Not on file  Social History Narrative   Not on file   Social Determinants of Health   Financial Resource Strain: Not on file  Food Insecurity: Not on file  Transportation  Needs: Not on file  Physical Activity: Not on file  Stress: Not on file  Social Connections: Not on file   Allergies  Allergen Reactions   Amoxicillin Hives   Penicillins Hives   [name] family history on file.   Current Outpatient Medications (Cardiovascular):    rosuvastatin (CRESTOR) 20 MG tablet, Take 20 mg by mouth daily.     Current Outpatient Medications (Other):    ALPRAZolam (XANAX) 0.5 MG tablet, Take 0.5 mg by mouth daily as needed for anxiety.   diclofenac sodium (VOLTAREN) 1 % GEL, APPLY 2 GRAMS TOPICALLY 4 TIMES A DAY   gabapentin (NEURONTIN) 100 MG capsule, Take 2 capsules (200 mg total) by mouth at bedtime.   omeprazole (PRILOSEC) 20 MG capsule, Take 20 mg by mouth daily.   tetrahydrozoline 0.05 % ophthalmic solution, Place 1 drop into both eyes 4 (four) times daily as needed (for dry/irritated eyes).   Reviewed prior external information including notes and imaging from  primary care provider As well as notes that were available from care everywhere and other healthcare systems.  Past medical history, social, surgical and family history all reviewed in electronic medical record.  [name] pertanent information unless stated regarding to the chief complaint.   Review of Systems:  [name] headache, visual changes, nausea, vomiting, diarrhea, constipation, dizziness, abdominal pain, skin rash, fevers, chills, night  sweats, weight loss, swollen lymph nodes, body aches, joint swelling, chest pain, shortness of breath, mood changes. POSITIVE muscle aches  Objective  Blood pressure 124/84, pulse 75, height '5\' 3"'$  (1.6 m), weight 172 lb (78 kg), SpO2 97 %.   General: [name] apparent distress alert and oriented x3 mood and affect normal, dressed appropriately.  HEENT: Pupils equal, extraocular movements intact  Respiratory: Patient's speak in full sentences and does not appear short of breath  Cardiovascular: [name] lower extremity edema, non tender, [name] erythema  Gait normal with good  balance and coordination.  MSK: Patient does have significant loss of lordosis of the neck.  Only 5 degrees of extension of the neck.  Very minimal sidebending to the right with increasing discomfort and pain.  Patient though does have 5 out of 5 strength of the hands which patient does have significant arthritic changes.  Patient's right shoulder exam has 5 out of 5 strength of the rotator cuff with positive impingement with Neer and Hawkins.  Patient does have positive crossover.  Limited muscular skeletal ultrasound was performed and interpreted by Hulan Saas, M  Limited ultrasound of patient's shoulder shows the patient does have some glenohumeral arthritic changes noted.  Rotator cuff including the subscapularis appears to be intact.  Mild degenerative changes noted of the supraspinatus.  Patient does have severe arthritic changes of the acromioclavicular joint with hypoechoic changes consistent with a small effusion. 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  [id number]; 15 additional minutes spent for Therapeutic exercises as stated in above notes.  This included exercises focusing on stretching, strengthening, with significant focus on eccentric aspects.   Long term goals include an improvement in range of motion, strength, endurance as well as avoiding reinjury. Patient's frequency would include in 1-2 times a day, 3-5 times a week for a duration of 6-12 weeks.  Shoulder Exercises that included:  Basic scapular stabilization to include adduction and depression of scapula Scaption, focusing on proper movement and good control Internal and External rotation utilizing a theraband, with elbow tucked at side entire time Rows with theraband  Proper technique shown and discussed handout in great detail with ATC.  All questions were discussed and answered.      Impression and Recommendations:     The above documentation has been  reviewed and is accurate and complete Kara Pulley, DO

## 2021-03-29 ENCOUNTER — Ambulatory Visit: Payer: Self-pay

## 2021-03-29 ENCOUNTER — Other Ambulatory Visit: Payer: Self-pay

## 2021-03-29 ENCOUNTER — Encounter: Payer: Self-pay | Admitting: Family Medicine

## 2021-03-29 ENCOUNTER — Ambulatory Visit: Payer: Medicare HMO | Admitting: Family Medicine

## 2021-03-29 ENCOUNTER — Ambulatory Visit (INDEPENDENT_AMBULATORY_CARE_PROVIDER_SITE_OTHER): Payer: Medicare HMO

## 2021-03-29 VITALS — BP 124/84 | HR 75 | Ht 63.0 in | Wt 172.0 lb

## 2021-03-29 DIAGNOSIS — G8929 Other chronic pain: Secondary | ICD-10-CM

## 2021-03-29 DIAGNOSIS — M542 Cervicalgia: Secondary | ICD-10-CM | POA: Diagnosis not present

## 2021-03-29 DIAGNOSIS — M25512 Pain in left shoulder: Secondary | ICD-10-CM | POA: Diagnosis not present

## 2021-03-29 DIAGNOSIS — M19019 Primary osteoarthritis, unspecified shoulder: Secondary | ICD-10-CM | POA: Insufficient documentation

## 2021-03-29 DIAGNOSIS — M25511 Pain in right shoulder: Secondary | ICD-10-CM

## 2021-03-29 DIAGNOSIS — M19011 Primary osteoarthritis, right shoulder: Secondary | ICD-10-CM | POA: Diagnosis not present

## 2021-03-29 DIAGNOSIS — M5030 Other cervical disc degeneration, unspecified cervical region: Secondary | ICD-10-CM | POA: Diagnosis not present

## 2021-03-29 MED ORDER — GABAPENTIN 100 MG PO CAPS: 200.0000 mg | ORAL_CAPSULE | Freq: Every day | ORAL | 0 refills | Status: DC

## 2021-03-29 NOTE — Patient Instructions (Signed)
Gabapentin '200mg'$  at night PT [address] [place] today Exercises today Ice 20 min 2x a day Voltaren gel See me in 6-7 weeks

## 2021-03-29 NOTE — Assessment & Plan Note (Signed)
Right-sided.  Discussed with patient about icing regimen, home exercises, which activities to do which wants to avoid.  Increase activity slowly.  Patient will follow up with me again in 6 to 8 weeks

## 2021-03-29 NOTE — Assessment & Plan Note (Signed)
Patient is having radicular symptoms that seems to be more degenerative cervical.  I do believe that the arthritic changes is likely contributing.  Patient does have some arthritic changes of the acromioclavicular joint as well.  We will start with formal physical therapy.  X-rays pending.  Started on low-dose of gabapentin at nighttime.  Discussed potential side effects.  Discussed icing regimen.  Follow-up with me again in 6 weeks.  Worsening pain consider injection in the acromioclavicular joint or potentially advanced imaging if any weakness occurs.

## 2021-04-11 ENCOUNTER — Encounter: Payer: Self-pay | Admitting: Physical Therapy

## 2021-04-11 ENCOUNTER — Other Ambulatory Visit: Payer: Self-pay

## 2021-04-11 ENCOUNTER — Ambulatory Visit: Payer: Medicare HMO | Attending: Family Medicine | Admitting: Physical Therapy

## 2021-04-11 DIAGNOSIS — M25511 Pain in right shoulder: Secondary | ICD-10-CM | POA: Diagnosis not present

## 2021-04-11 DIAGNOSIS — R293 Abnormal posture: Secondary | ICD-10-CM | POA: Insufficient documentation

## 2021-04-11 DIAGNOSIS — G8929 Other chronic pain: Secondary | ICD-10-CM | POA: Diagnosis not present

## 2021-04-11 DIAGNOSIS — M542 Cervicalgia: Secondary | ICD-10-CM | POA: Diagnosis not present

## 2021-04-11 NOTE — Patient Instructions (Signed)
Access Code: [email or web address] URL: [email or web address] Date: 04/11/2021 Prepared by: [name]  Exercises Supine Cervical Retraction with Towel - 2 x daily - 7 x weekly - 2 sets - 10 reps - 5 hold [place] Stretch - 2 x daily - 7 x weekly - 1 sets - 3 reps - 20-30 hold Seated Cervical Sidebending Stretch - 2 x daily - 7 x weekly - 1 sets - 5 reps - 30 hold

## 2021-04-11 NOTE — Therapy (Signed)
[place], [place], [address] Phone: [phone]   Fax:  [phone]  Physical Therapy Evaluation  Patient Details  Name: Kara [name] MRN: [id number] Date of Birth: November 04, 1945 Referring Provider (PT): Dr. Hulan Saas   Encounter Date: 04/11/2021   PT End of Session - 04/11/21 1118     Visit Number 1    Number of Visits 12    Date for PT Re-Evaluation 05/23/21    Authorization Type Aet[employer] MCR    Progress Note Due on Visit 10    PT Start Time 0933    PT Stop Time 1019    PT Time Calculation (min) 46 min    Activity Tolerance Patient tolerated treatment well    Behavior During Therapy [facility] for tasks assessed/performed             Past Medical History:  Diagnosis Date   Acid reflux    GERD (gastroesophageal reflux disease)    Hypercholesteremia    Hyperlipidemia     Past Surgical History:  Procedure Laterality Date   HERNIA REPAIR      There were no vitals filed for this visit.    Subjective Assessment - 04/11/21 0940     Subjective Patient presents with CC of Rt sided neck and shoulder pain.  She has difficulty reaching across her body.  She has generally min pain unless she reaches back or over does activity.  She is not weak or sensory changes.  She has Rt sided tension and stiffness in her neck  She noticed this about a year ago along with a reduction in her activity.    Limitations Lifting;House hold activities;Other (comment)   ADLs   Diagnostic tests [place], XR see notes    Patient Stated Goals reduce pain    Currently in Pain? Yes    Pain Score 3     Pain Location Neck    Pain Orientation Right    Pain Descriptors / Indicators Aching;Tightness    Pain Type Chronic pain    Pain Radiating Towards shoulder blades    Pain Onset More than a month ago    Pain Frequency Intermittent    Aggravating Factors  turning her head, sitting unsupported    Pain Relieving Factors resting at  times, medicine (OTC and gabapentin)    Effect of Pain on Daily Activities always uncomfortable    Multiple Pain Sites Yes    Pain Score 0    Pain Location Shoulder    Pain Orientation Right    Pain Descriptors / Indicators Sharp;Jabbing    Pain Type Chronic pain    Pain Onset More than a month ago    Pain Frequency Intermittent    Aggravating Factors  reaching across    Pain Relieving Factors rest this pain is usually transient    Effect of Pain on Daily Activities painful to use her dominant arm                [facility] PT Assessment - 04/11/21 0001       Assessment   Medical Diagnosis cervical spine pain , R shoulder pain    Referring Provider (PT) Dr. Hulan Saas    Onset Date/Surgical Date --   1 yr   Hand Dominance Right    Next MD Visit as needed    Prior Therapy No      Precautions   Precautions None      Restrictions   Weight Bearing  Restrictions No      Balance Screen   Has the patient fallen in the past 6 months No      [place] residence    Living Arrangements Spouse/significant other    Available Help at Discharge Family    Type of [place] to enter      Prior Function   Level of Independence Independent    Leisure walks twice daily, reading and has 5 grandkids      Cognition   Overall Cognitive Status Within Functional Limits for tasks assessed      Observation/Other Assessments   Focus on Therapeutic Outcomes (FOTO)  Neck 54 % Shoulder 67%      Sensation   Light Touch Appears Intact      Coordination   Gross Motor Movements are Fluid and Coordinated Not tested      Posture/Postural Control   Posture/Postural Control Postural limitations    Postural Limitations Rounded Shoulders;Forward head;Increased thoracic kyphosis;Posterior pelvic tilt    Posture Comments Rt shoulder dropped lower than L      AROM   Right Shoulder Flexion 154 Degrees    Right Shoulder ABduction 150  Degrees    Right Shoulder Internal Rotation --   FR to L4, pain   Right Shoulder External Rotation --   FR to T2   Left Shoulder Flexion 160 Degrees    Left Shoulder ABduction 160 Degrees    Left Shoulder Internal Rotation --   mid T spine   Cervical Flexion 45    Cervical Extension 30    Cervical - Right Side Bend 20    Cervical - Left Side Bend 20    Cervical - Right Rotation 25%    Cervical - Left Rotation 25%      PROM   Overall PROM Comments shoulder WNL with pain end range flexion, ER and IR      Strength   Right Shoulder Flexion 4+/5    Right Shoulder ABduction 4+/5    Right Shoulder Internal Rotation 5/5    Right Shoulder External Rotation 4/5    Left Shoulder Flexion 4+/5    Left Shoulder ABduction 4+/5      Palpation   Spinal mobility hypomobile throughout mid cervicals    Palpation comment sore along suboccipitals, Rt upper trap tension noted. No pain currently at Rt [facility] joint or ant. shoudler, sore in deltoid (ant and middle)      Special Tests   Other special tests pain relief with suboccipital release, manual stretching, and light tractioning C spine      Transfers   Comments NT      Ambulation/Gait   Gait Comments NT                        Objective measurements completed on examination: See above findings.       [facility] Adult PT Treatment/Exercise - 04/11/21 0001       Self-Care   Self-Care Other Self-Care Comments;Posture    Posture pillows for support, HEP    Other Self-Care Comments  rationale for exercises, PT POC      Neck Exercises: Supine   Neck Retraction 5 secs;10 reps      Neck Exercises: Stretches   Upper Trapezius Stretch 3 reps;20 seconds                  Upper Extremity Functional Index Score:  /  46   PT Education - 04/11/21 1118     Education Details see flowsheet    Person(s) Educated Patient    Methods Explanation;Demonstration;Verbal cues;Handout    Comprehension Verbalized understanding;Returned  demonstration                 PT Long Term Goals - 04/11/21 1119       PT LONG TERM GOAL #1   Title Pt will be I with strengthening program for bilateral UEs , cervicals and posture    Time 6    Period Weeks    Status New    Target Date 05/23/21      PT LONG TERM GOAL #2   Title Pt will be able to reach across her body for ADLs without Rt shoulder pain    Time 6    Period Weeks    Status New    Target Date 05/23/21      PT LONG TERM GOAL #3   Title Pt will be able to increase cervical AROM in all planes by 10 deg or more in order to drive, interact with environment    Time 6    Period Weeks    Status New    Target Date 05/23/21      PT LONG TERM GOAL #4   Title Pt will improve FOTO to 62 % in neck and > 70% in shoulder    Time 6    Period Weeks    Status New    Target Date 05/23/21                    Plan - 04/11/21 1042     Clinical Impression Statement Patient presents for low complexity eval of neck and Rt shoulder pain due to arthritic changes in cervical spine as well as GH and AC joints. She presents with decreased AROM in cervical spine, pain with R UE end range flexion and internal rotation, abnormal posture and muscle tension. She is motivated to improve her posture and ability to reach, use her dominant arm without limitation of pain .    Personal Factors and Comorbidities Age;Time since onset of injury/illness/exacerbation    Examination-Activity Limitations Dressing;Lift;Carry;Reach Overhead    Examination-Participation Restrictions Interpersonal Relationship;Laundry;Community Activity;Driving    Stability/Clinical Decision Making Stable/Uncomplicated    Clinical Decision Making Low    Rehab Potential Excellent    PT Frequency 2x / week    PT Duration 6 weeks    PT Treatment/Interventions Cryotherapy;Therapeutic exercise;Patient/family education;Manual techniques;Passive range of motion;Moist Heat;Iontophoresis '4mg'$ /ml  Dexamethasone;Electrical Stimulation;Ultrasound;Functional mobility training;Therapeutic activities;Neuromuscular re-education;Taping    PT Next Visit Plan build HEP for strenght, consider tape to Rt UT, cervical manual /STW and DN    PT Home Exercise Plan Access Code [email or web address]    Consulted and Agree with Plan of Care Patient             Patient will benefit from skilled therapeutic intervention in order to improve the following deficits and impairments:  Decreased mobility, Hypomobility, Decreased range of motion, Decreased strength, Increased fascial restricitons, Impaired flexibility, Impaired UE functional use, Pain, Postural dysfunction  Visit Diagnosis: Abnormal posture  Cervicalgia  Chronic right shoulder pain     Problem List Patient Active Problem List   Diagnosis Date Noted   Degenerative cervical disc 03/29/2021   AC (acromioclavicular) arthritis 03/29/2021   Left shoulder pain 05/27/2019   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

## 2021-04-23 ENCOUNTER — Other Ambulatory Visit: Payer: Self-pay

## 2021-04-23 ENCOUNTER — Encounter: Payer: Self-pay | Admitting: Physical Therapy

## 2021-04-23 ENCOUNTER — Ambulatory Visit: Payer: Medicare HMO | Admitting: Physical Therapy

## 2021-04-23 DIAGNOSIS — M25511 Pain in right shoulder: Secondary | ICD-10-CM

## 2021-04-23 DIAGNOSIS — R293 Abnormal posture: Secondary | ICD-10-CM | POA: Diagnosis not present

## 2021-04-23 DIAGNOSIS — M542 Cervicalgia: Secondary | ICD-10-CM

## 2021-04-23 DIAGNOSIS — G8929 Other chronic pain: Secondary | ICD-10-CM | POA: Diagnosis not present

## 2021-04-23 NOTE — Therapy (Signed)
[place], [place], [address] Phone: [phone]   Fax:  [phone]  Physical Therapy Treatment  Patient Details  Name: Kara [name] MRN: [id number] Date of Birth: [birth year] Referring Provider (PT): Dr. Hulan Saas   Encounter Date: 04/23/2021   PT End of Session - 04/23/21 0936     Visit Number 2    Number of Visits 12    Date for PT Re-Evaluation 05/23/21    Authorization Type Aetna MCR    Progress Note Due on Visit 10    PT Start Time 0933    PT Stop Time 1020    PT Time Calculation (min) 47 min    Activity Tolerance Patient tolerated treatment well    Behavior During Therapy [facility] for tasks assessed/performed             Past Medical History:  Diagnosis Date   Acid reflux    GERD (gastroesophageal reflux disease)    Hypercholesteremia    Hyperlipidemia     Past Surgical History:  Procedure Laterality Date   HERNIA REPAIR      There were no vitals filed for this visit.   Subjective Assessment - 04/23/21 0935     Subjective There is always tension, when I turn to the Rt a tad it hurts a little.  I think its better than it was.  Theres little rhyme or reason.    Currently in Pain? No/denies               [place] Exercise  UBE L1 3 min forward and 3 min back  Supine chin tuck with towel x 10  Horizontal abduction red x 10 Narrow grip overhead pull x 10   Seated upper trap and levator scap 30 sec x 2 each side  Standing on wall  Wall angels x 10 Horizontal pull and narrow grip in standing x 10 each cues to keep neck in alignment and upper traps relaxed Wall slides for thoracic extension x 10   Corner stretch x 3 , 30 sec   Manual therapy Soft tissue , manual therapy  Suboccipital release and gentle friction  MFR to upper traps, levator scap Rt side   Lateral cervicals, scalenes PROM in rotation bilateral, brief      PT Education - 04/23/21 1112      Education Details HEP, posture, trigger point    Person(s) Educated Patient    Methods Explanation;Handout;Verbal cues    Comprehension Verbalized understanding;Returned demonstration                 PT Long Term Goals - 04/11/21 1119       PT LONG TERM GOAL #1   Title Pt will be I with strengthening program for bilateral UEs , cervicals and posture    Time 6    Period Weeks    Status New    Target Date 05/23/21      PT LONG TERM GOAL #2   Title Pt will be able to reach across her body for ADLs without Rt shoulder pain    Time 6    Period Weeks    Status New    Target Date 05/23/21      PT LONG TERM GOAL #3   Title Pt will be able to increase cervical AROM in all planes by 10 deg or more in order to drive, interact with environment    Time 6    Period Weeks  Status New    Target Date 05/23/21      PT LONG TERM GOAL #4   Title Pt will improve FOTO to 62 % in neck and > 70% in shoulder    Time 6    Period Weeks    Status New    Target Date 05/23/21                   Plan - 04/23/21 [id number]     Clinical Impression Statement Patient reports less pain overall and in particular following manual therapy today.  Rt upper trap more tense and painful than L side. Changed HEP for her to do more throughout her day (vs lying down).  No pain post, greater Rt sided rotation.    PT Treatment/Interventions Cryotherapy;Therapeutic exercise;Patient/family education;Manual techniques;Passive range of motion;Moist Heat;Iontophoresis '4mg'$ /ml Dexamethasone;Electrical Stimulation;Ultrasound;Functional mobility training;Therapeutic activities;Neuromuscular re-education;Taping    PT Next Visit Plan build HEP for strenght, consider tape to Rt UT, cervical manual /STW and DN    PT Home Exercise Plan Access Code [email or web address]    Consulted and Agree with Plan of Care Patient             Patient will benefit from skilled therapeutic intervention in order to improve the following  deficits and impairments:  Decreased mobility, Hypomobility, Decreased range of motion, Decreased strength, Increased fascial restricitons, Impaired flexibility, Impaired UE functional use, Pain, Postural dysfunction  Visit Diagnosis: Cervicalgia  Chronic right shoulder pain  Abnormal posture     Problem List Patient Active Problem List   Diagnosis Date Noted   Degenerative cervical disc 03/29/2021   AC (acromioclavicular) arthritis 03/29/2021   Left shoulder pain 05/27/2019   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 [place], [place], [address] Phone: [phone]   Fax:  [phone]  Name: Kara [name] MRN: [id number] Date of Birth: [birth year]   [name], PT 04/23/21 12:10 PM Phone: [phone] Fax: [phone]

## 2021-04-27 ENCOUNTER — Ambulatory Visit: Payer: Medicare HMO | Admitting: Physical Therapy

## 2021-05-01 ENCOUNTER — Encounter: Payer: Self-pay | Admitting: Physical Therapy

## 2021-05-01 ENCOUNTER — Other Ambulatory Visit: Payer: Self-pay

## 2021-05-01 ENCOUNTER — Ambulatory Visit: Payer: Medicare HMO | Admitting: Physical Therapy

## 2021-05-01 DIAGNOSIS — R293 Abnormal posture: Secondary | ICD-10-CM

## 2021-05-01 DIAGNOSIS — M25511 Pain in right shoulder: Secondary | ICD-10-CM | POA: Diagnosis not present

## 2021-05-01 DIAGNOSIS — M542 Cervicalgia: Secondary | ICD-10-CM

## 2021-05-01 DIAGNOSIS — G8929 Other chronic pain: Secondary | ICD-10-CM

## 2021-05-01 NOTE — Patient Instructions (Addendum)

## 2021-05-01 NOTE — Therapy (Signed)
[place], [place], [address] Phone: [phone]   Fax:  [phone]  Physical Therapy Treatment  Patient Details  Name: Kara [name] MRN: [id number] Date of Birth: August 20, 1945 Referring Provider (PT): Dr. Hulan Saas   Encounter Date: 05/01/2021   PT End of Session - 05/01/21 1121     Visit Number 3    Number of Visits 12    Date for PT Re-Evaluation 05/23/21    Authorization Type Aetna MCR    PT Start Time 7341    PT Stop Time 1015    PT Time Calculation (min) 41 min    Activity Tolerance Patient tolerated treatment well    Behavior During Therapy [facility] for tasks assessed/performed             Past Medical History:  Diagnosis Date   Acid reflux    GERD (gastroesophageal reflux disease)    Hypercholesteremia    Hyperlipidemia     Past Surgical History:  Procedure Laterality Date   HERNIA REPAIR      There were no vitals filed for this visit.   Subjective Assessment - 05/01/21 0940     Subjective I always have pain right specifically in the R neck.  I have trouble getting a pill for my husband in the cushion that i dropped and it hurt my Right shld when I tried to reach for it    Limitations Lifting;House hold activities;Other (comment)    Diagnostic tests [place], XR see notes    Patient Stated Goals reduce pain    Pain Score 2     Pain Location Neck    Pain Orientation Right    Pain Descriptors / Indicators Aching;Tightness    Pain Type Chronic pain    Pain Onset More than a month ago    Pain Score 2    Pain Location Shoulder   only when ADDUCTING   Pain Orientation Right    Pain Descriptors / Indicators Sharp              Therapeutic Exercise   UBE L2.5  3 min forward and 3 min back  Supine chin tuck with towel x 10  5 sec hold Horizontal abduction red x 10 Narrow grip overhead pull x 10   Right sidelying   Posterior shld stretch 3 x 30-60 sec   Seated upper  trap and levator scap 30 sec x 2 each side   Standing on wall  Wall angels x 10 Horizontal pull and narrow grip in standing x 10 each cues to keep neck in alignment and upper traps relaxed Wall slides for thoracic extension x 10   Corner stretch x 3 , 30 sec    Manual therapy Soft tissue , manual therapy Post TPDN             Suboccipital release and gentle friction             MFR to upper traps, levator scap Rt side              Lateral cervicals, scalenes PROM in rotation bilateral, brief                  [facility] Adult PT Treatment/Exercise - 05/01/21 0001       Self-Care   Self-Care Other Self-Care Comments    Other Self-Care Comments  box deep breathing with thoracic ext.  explanaton of arthritis  Trigger Point Dry Needling - 05/01/21 0001     Consent Given? Yes    Education Handout Provided Yes    Muscles Treated Head and Neck Levator scapulae;Upper trapezius   right only   Dry Needling Comments 50 mm .25 gage    Upper Trapezius Response Twitch reponse elicited;Palpable increased muscle length    Levator Scapulae Response Twitch response elicited;Palpable increased muscle length                   PT Education - 05/01/21 0942     Education Details educated on TPDN and precautians and aftercare, explanation of arthritis/ need for exercise and movement and added for posterior chain added posterior shoulder stretch    Person(s) Educated Patient    Methods Explanation;Handout    Comprehension Verbalized understanding                 PT Long Term Goals - 04/11/21 1119       PT LONG TERM GOAL #1   Title Pt will be I with strengthening program for bilateral UEs , cervicals and posture    Time 6    Period Weeks    Status New    Target Date 05/23/21      PT LONG TERM GOAL #2   Title Pt will be able to reach across her body for ADLs without Rt shoulder pain    Time 6    Period Weeks    Status New    Target Date 05/23/21       PT LONG TERM GOAL #3   Title Pt will be able to increase cervical AROM in all planes by 10 deg or more in order to drive, interact with environment    Time 6    Period Weeks    Status New    Target Date 05/23/21      PT LONG TERM GOAL #4   Title Pt will improve FOTO to 62 % in neck and > 70% in shoulder    Time 6    Period Weeks    Status New    Target Date 05/23/21                   Plan - 05/01/21 1012     Clinical Impression Statement Pt reports doiing well but iwth 2/10 pain in neck and shld.  Pt consents to TPDN and is educated on precautians and aftercare. Pt reports sharp pain in R shld when she tried to reach for a droppped pill in a cushion belonging to husband.  Pt responded well to posterior capsule stretch and was given as HEP.  Pt had concerns about increasing artrhitis with movement but was educated on importance of exercise and movement for all cause mortatlity and benefits for continuing to maximize AROM/strength as we age.  Pt reported no pain in shld and neck post Rx session    Personal Factors and Comorbidities Age;Time since onset of injury/illness/exacerbation    Examination-Activity Limitations Dressing;Lift;Carry;Reach Overhead    Examination-Participation Restrictions Interpersonal Relationship;Laundry;Community Activity;Driving    PT Frequency 2x / week    PT Duration 6 weeks    PT Treatment/Interventions Cryotherapy;Therapeutic exercise;Patient/family education;Manual techniques;Passive range of motion;Moist Heat;Iontophoresis 4mg /ml Dexamethasone;Electrical Stimulation;Ultrasound;Functional mobility training;Therapeutic activities;Neuromuscular re-education;Taping    PT Next Visit Plan assess TPDN for levator/ Upper trap on R.  build HEP for strenght, consider tape to Rt UT, cervical manual /STW and DN    PT Home Exercise Plan Access Code [email or web address]  Consulted and Agree with Plan of Care Patient             Patient will benefit from skilled  therapeutic intervention in order to improve the following deficits and impairments:  Decreased mobility, Hypomobility, Decreased range of motion, Decreased strength, Increased fascial restricitons, Impaired flexibility, Impaired UE functional use, Pain, Postural dysfunction  Visit Diagnosis: Cervicalgia  Chronic right shoulder pain  Abnormal posture     Problem List Patient Active Problem List   Diagnosis Date Noted   Degenerative cervical disc 03/29/2021   AC (acromioclavicular) arthritis 03/29/2021   Left shoulder pain 05/27/2019   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 [place], [place], [address] Phone: [phone]   Fax:  [phone]  Name: SAKSHI SERMONS MRN: [id number] Date of Birth: Apr 06, 1946

## 2021-05-04 ENCOUNTER — Other Ambulatory Visit: Payer: Self-pay

## 2021-05-04 ENCOUNTER — Encounter: Payer: Self-pay | Admitting: Physical Therapy

## 2021-05-04 ENCOUNTER — Ambulatory Visit: Payer: Medicare HMO | Admitting: Physical Therapy

## 2021-05-04 DIAGNOSIS — R293 Abnormal posture: Secondary | ICD-10-CM

## 2021-05-04 DIAGNOSIS — G8929 Other chronic pain: Secondary | ICD-10-CM

## 2021-05-04 DIAGNOSIS — M542 Cervicalgia: Secondary | ICD-10-CM | POA: Diagnosis not present

## 2021-05-04 DIAGNOSIS — M25511 Pain in right shoulder: Secondary | ICD-10-CM | POA: Diagnosis not present

## 2021-05-04 NOTE — Therapy (Signed)
[place], [place], [address] Phone: [phone]   Fax:  [phone]  Physical Therapy Treatment  Patient Details  Name: Kara [name] MRN: [id number] Date of Birth: 1946/06/25 Referring Provider (PT): Dr. Hulan Saas   Encounter Date: 05/04/2021 added  PT End of Session - 05/04/21 1031     Visit Number 4    Number of Visits 12    Date for PT Re-Evaluation 05/23/21    Authorization Type Aetna MCR    PT Start Time 1020    PT Stop Time 1100    PT Time Calculation (min) 40 min    Activity Tolerance Patient tolerated treatment well    Behavior During Therapy [facility] for tasks assessed/performed             Past Medical History:  Diagnosis Date   Acid reflux    GERD (gastroesophageal reflux disease)    Hypercholesteremia    Hyperlipidemia     Past Surgical History:  Procedure Laterality Date   HERNIA REPAIR      There were no vitals filed for this visit.   Subjective Assessment - 05/04/21 1026     Subjective I was fine with the needling the other day  but i dont need to do it any more.   I am feeling well  about a 2/10.    Limitations Lifting;House hold activities;Other (comment)    Diagnostic tests [place], XR see notes    Patient Stated Goals reduce pain    Currently in Pain? Yes    Pain Score 2     Pain Location Neck    Pain Orientation Right    Pain Descriptors / Indicators Aching;Tightness    Pain Type Chronic pain    Pain Onset More than a month ago    Pain Score 2    Pain Location Shoulder    Pain Orientation Right    Pain Descriptors / Indicators Sharp   at end range of post capsule   Pain Onset More than a month ago            Therapeutic Exercise   Supine chin tuck with towel x 10  5 sec hold Horizontal abduction red x 10 Narrow grip overhead pull x 10    Right sidelying   Posterior shld stretch 3 x 30-60 sec with pt assisting herself today with proper  position   Seated upper trap and levator scap 30 sec x 2 each side Added to HEP thoracic extension with elbows on wall x 10   Standing on wall  Wall angels x 10 added to HEP Horizontal and diagonals shoulder start patter x 10 with RTB Wall slides for thoracic extension x 10   Corner stretch x 3 , 30 sec    Self care Sleep hygiene and community wellness                                         PT Education - 05/04/21 1047     Education Details added thoracic extension in chair wall angels and horizontal abd/diagonals star pattern resisted ot HEP  and reviewed HEP for home    Person(s) Educated Patient    Methods Explanation;Demonstration;Tactile cues;Verbal cues;Handout    Comprehension Verbalized understanding;Returned demonstration                 PT Long Term Goals -  05/04/21 1242       PT LONG TERM GOAL #1   Title Pt will be I with strengthening program for bilateral UEs , cervicals and posture    Time 6    Period Weeks    Status On-going      PT LONG TERM GOAL #2   Title Pt will be able to reach across her body for ADLs without Rt shoulder pain    Baseline No pain reaching across body today in clinic    Time 6    Period Weeks    Status Achieved      PT LONG TERM GOAL #3   Title Pt will be able to increase cervical AROM in all planes by 10 deg or more in order to drive, interact with environment    Time 6    Period Weeks    Status Unable to assess      PT LONG TERM GOAL #4   Title Pt will improve FOTO to 62 % in neck and > 70% in shoulder    Time 6    Period Weeks    Status On-going             Access Code: [email or web address]: [email or web address]: 09/30/2022Prepared by: Donnetta Simpers BeardsleyExercises   Seated Thoracic Extension at Wall - 1-2 x daily - 7 x weekly - 1 sets - 10 reps  Wall Angels - 1 x daily - 7 x weekly - 3 sets - 10 reps  Standing Shoulder Diagonal Horizontal Abduction 60/120 Degrees with  Resistance - 1 x daily - 7 x weekly - 3 sets - 10 reps      Plan - 05/04/21 1041     Clinical Impression Statement Pt doing well with 2/10 pain in neck and shld but has increased AROM with horizontal abduction without illiciting pain.  Pt benefitted from TPDN last visit but declines today.   Added to HEP and added upper thoracic extension stretch.  Pt tolerated exercises well and had no pain in neck and shld at end of session    Personal Factors and Comorbidities Age;Time since onset of injury/illness/exacerbation    Examination-Activity Limitations Dressing;Lift;Carry;Reach Overhead    Examination-Participation Restrictions Interpersonal Relationship;Laundry;Community Activity;Driving    PT Treatment/Interventions Cryotherapy;Therapeutic exercise;Patient/family education;Manual techniques;Passive range of motion;Moist Heat;Iontophoresis 4mg /ml Dexamethasone;Electrical Stimulation;Ultrasound;Functional mobility training;Therapeutic activities;Neuromuscular re-education;Taping    PT Home Exercise Plan Access Code [email or web address]    Consulted and Agree with Plan of Care Patient             Patient will benefit from skilled therapeutic intervention in order to improve the following deficits and impairments:  Decreased mobility, Hypomobility, Decreased range of motion, Decreased strength, Increased fascial restricitons, Impaired flexibility, Impaired UE functional use, Pain, Postural dysfunction  Visit Diagnosis: Cervicalgia  Chronic right shoulder pain  Abnormal posture  Voncille Lo, PT, [place] Certified Exercise Expert for the Aging Adult  05/04/21 12:46 PM Phone: [phone] Fax: [phone]    Problem List Patient Active Problem List   Diagnosis Date Noted   Degenerative cervical disc 03/29/2021   [facility] (acromioclavicular) arthritis 03/29/2021   Left shoulder pain 05/27/2019   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

## 2021-05-04 NOTE — Patient Instructions (Addendum)
     Sleep Tips   From a Book  Why We Sleep    Keep a consistent sleep schedule. Get up at the same time every day, even on weekends or during vacations. Set a bedtime that is early enough for you to get at least 7-9 hours of sleep. Don't go to bed unless you are sleepy.  If you don't fall asleep after 20 minutes, get out of bed.  Establish a relaxing bedtime routine.  Use your bed only for sleep and sex.  Make your bedroom quiet and relaxing. Keep the room at a comfortable, cool temperature.  Limit exposure to bright light in the evenings. Turn off electronic devices at least 30 minutes before bedtime. Don't eat a large meal before bedtime. If you are hungry at night, eat a light, healthy snack.  Exercise regularly and maintain a healthy diet.  Avoid consuming caffeine in the late afternoon or evening.  Avoid consuming alcohol before bedtime.  Reduce your fluid intake before bedtime.  Voncille Lo, PT, [place] Certified Exercise Expert for the Aging Adult  05/04/21 10:47 AM Phone: [phone] Fax: [phone]

## 2021-05-08 ENCOUNTER — Other Ambulatory Visit: Payer: Self-pay

## 2021-05-08 ENCOUNTER — Ambulatory Visit: Payer: Medicare HMO | Attending: Family Medicine | Admitting: Physical Therapy

## 2021-05-08 ENCOUNTER — Encounter: Payer: Self-pay | Admitting: Physical Therapy

## 2021-05-08 VITALS — BP 105/73 | HR 89

## 2021-05-08 DIAGNOSIS — M542 Cervicalgia: Secondary | ICD-10-CM | POA: Diagnosis not present

## 2021-05-08 DIAGNOSIS — R293 Abnormal posture: Secondary | ICD-10-CM | POA: Insufficient documentation

## 2021-05-08 DIAGNOSIS — G8929 Other chronic pain: Secondary | ICD-10-CM | POA: Diagnosis not present

## 2021-05-08 DIAGNOSIS — M25511 Pain in right shoulder: Secondary | ICD-10-CM | POA: Diagnosis not present

## 2021-05-08 NOTE — Patient Instructions (Signed)
     Voncille Lo, PT, [place] Certified Exercise Expert for the Aging Adult  05/08/21 9:58 AM Phone: [phone] Fax: [phone]

## 2021-05-08 NOTE — Therapy (Signed)
[place], [place], [address] Phone: [phone]   Fax:  [phone]  Physical Therapy Treatment  Patient Details  Name: Kara [name] MRN: [id number] Date of Birth: May 17, 1946 Referring Provider (PT): Dr. Hulan Saas   Encounter Date: 05/08/2021   PT End of Session - 05/08/21 0937     Visit Number 5    Number of Visits 12    Date for PT Re-Evaluation 05/23/21    Authorization Type Aetna MCR    Progress Note Due on Visit 10    PT Start Time 0935    PT Stop Time 1001    PT Time Calculation (min) 26 min    Activity Tolerance Patient tolerated treatment well    Behavior During Therapy [facility] for tasks assessed/performed             Past Medical History:  Diagnosis Date   Acid reflux    GERD (gastroesophageal reflux disease)    Hypercholesteremia    Hyperlipidemia     Past Surgical History:  Procedure Laterality Date   HERNIA REPAIR      Vitals:   05/08/21 0900 05/08/21 1007  BP: (!) 81/58 105/73  Pulse: 63 89  SpO2: 94% 96%     Subjective Assessment - 05/08/21 1007     Subjective i feel fine today  no pain.  I only drank a sip of diet coke this morning.  ( pt not hydrated for session this morning)    Limitations Lifting;House hold activities;Other (comment)    Diagnostic tests [place], XR see notes    Patient Stated Goals reduce pain    Currently in Pain? No/denies    Pain Score 0-No pain    Pain Location Neck    Pain Orientation Right    Pain Descriptors / Indicators Aching                               [facility] Adult PT Treatment/Exercise - 05/08/21 0001       Self-Care   Self-Care Other Self-Care Comments    Other Self-Care Comments  hydration, care for dizziness/ lightheaded ness      Neck Exercises: Supine   Neck Retraction 5 secs;10 reps      Neck Exercises: Stretches   Other Neck Stretches radial nerve flossing      Shoulder Exercises: Standing    Extension Strengthening;Both;10 reps;Theraband    Theraband Level (Shoulder Extension) Level 4 (Blue)    Extension Limitations x 3                     PT Education - 05/08/21 1006     Education Details added Radial nerve flossing and resisted shld ext to HEP. Pt with syncopal episode/feeling lightheaded    Person(s) Educated Patient    Methods Explanation;Demonstration;Tactile cues;Verbal cues;Handout    Comprehension Verbalized understanding;Returned demonstration                 PT Long Term Goals - 05/08/21 0935       PT LONG TERM GOAL #1   Title Pt will be I with strengthening program for bilateral UEs , cervicals and posture    Time 6    Period Weeks    Status On-going      PT LONG TERM GOAL #2   Title Pt will be able to reach across her body for ADLs without Rt shoulder pain  Baseline No pain reaching across body today in clinic    Time 6    Period Weeks    Status Achieved      PT LONG TERM GOAL #3   Title Pt will be able to increase cervical AROM in all planes by 10 deg or more in order to drive, interact with environment    Baseline Pt not feeling well in clinic 05-08-21 and left early    Time 6    Period Weeks    Status Unable to assess                   Plan - 05/08/21 0937     Clinical Impression Statement Pt with 0/10 pain today.  Pt began exercising with shoulder extensionand radial nerve glide and trying ot reproduce pain while crossing R arm across body with armpronation.  Pt became diaphoretic and nauseated and went to bathroom.  Pt BP taken 81/58, pulse 63 and SP02 94%    Pt  supine and feel elevated for 10 minutes ,  aftere sitting up BP 105/73 and given water to drink.  Pt commented that she had a sip of diet coke and nothing else this morning before coming to PT RX.   Pt left early after hydrating and normalized BP today    Personal Factors and Comorbidities Age;Time since onset of injury/illness/exacerbation     Examination-Activity Limitations Dressing;Lift;Carry;Reach Overhead    Examination-Participation Restrictions Interpersonal Relationship;Laundry;Community Activity;Driving    Rehab Potential Excellent    PT Frequency 2x / week    PT Duration 6 weeks    PT Treatment/Interventions Cryotherapy;Therapeutic exercise;Patient/family education;Manual techniques;Passive range of motion;Moist Heat;Iontophoresis 4mg /ml Dexamethasone;Electrical Stimulation;Ultrasound;Functional mobility training;Therapeutic activities;Neuromuscular re-education;Taping    PT Next Visit Plan FOTO and goals next visit,    PT Home Exercise Plan Access Code [email or web address]    Consulted and Agree with Plan of Care Patient             Patient will benefit from skilled therapeutic intervention in order to improve the following deficits and impairments:  Decreased mobility, Hypomobility, Decreased range of motion, Decreased strength, Increased fascial restricitons, Impaired flexibility, Impaired UE functional use, Pain, Postural dysfunction  Visit Diagnosis: Cervicalgia  Chronic right shoulder pain  Abnormal posture     Problem List Patient Active Problem List   Diagnosis Date Noted   Degenerative cervical disc 03/29/2021   AC (acromioclavicular) arthritis 03/29/2021   Left shoulder pain 05/27/2019   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 [place], [place], [address] Phone: [phone]   Fax:  [phone]  Name: Kara [name] MRN: [id number] Date of Birth: 1946/03/30

## 2021-05-10 ENCOUNTER — Ambulatory Visit: Payer: Medicare HMO | Admitting: Family Medicine

## 2021-05-11 ENCOUNTER — Ambulatory Visit: Payer: Medicare HMO | Admitting: Physical Therapy

## 2021-05-11 ENCOUNTER — Other Ambulatory Visit: Payer: Self-pay

## 2021-05-11 ENCOUNTER — Encounter: Payer: Self-pay | Admitting: Physical Therapy

## 2021-05-11 DIAGNOSIS — M25511 Pain in right shoulder: Secondary | ICD-10-CM

## 2021-05-11 DIAGNOSIS — G8929 Other chronic pain: Secondary | ICD-10-CM

## 2021-05-11 DIAGNOSIS — M542 Cervicalgia: Secondary | ICD-10-CM | POA: Diagnosis not present

## 2021-05-11 DIAGNOSIS — R293 Abnormal posture: Secondary | ICD-10-CM | POA: Diagnosis not present

## 2021-05-11 NOTE — Therapy (Signed)
[place], [place], [address] Phone: [phone]   Fax:  [phone]  Physical Therapy Treatment  Patient Details  Name: Kara [name] MRN: [id number] Date of Birth: [birth year] Referring Provider (PT): Dr. Hulan Saas   Encounter Date: 05/11/2021   PT End of Session - 05/11/21 0938     Visit Number 6    Number of Visits 12    Date for PT Re-Evaluation 05/23/21    Authorization Type Aetna MCR    PT Start Time 3295    PT Stop Time 1015    PT Time Calculation (min) 41 min    Activity Tolerance Patient tolerated treatment well    Behavior During Therapy [facility] for tasks assessed/performed             Past Medical History:  Diagnosis Date   Acid reflux    GERD (gastroesophageal reflux disease)    Hypercholesteremia    Hyperlipidemia     Past Surgical History:  Procedure Laterality Date   HERNIA REPAIR      There were no vitals filed for this visit.   Subjective Assessment - 05/11/21 0935     Subjective Feels better.  She decided to come down off of her Gabapentin a bit (take 1 instead of 2).  She does feel shaky in the AMs. The needling was OK.  It didnt really make a lasting difference.  In my shoulder is was a 4/10  then it goes away. Neck is not as stiff as it was.  2 mos ago it hurt at rest.    Currently in Pain? No/denies   as 4/10               Skilled therapy interventions:   Therapeutic Exercise: Nustep L5 UE and LE  Wall angels (goal post) ball at thoracic spine  ER and IR 2 lbs x 20  Goal post chest level abduction 2 lbs x 10   Upper trap stretch 2 x 30 sec , levator scapula x 2 x 30 sec   Overhead reaching bilateral UEs and alternating with chin tuck and core engagement  Lower trunk rotation with head turns x 10    Manual therapy  Soft tissue to posterior cervicals Light distraction  PROM in rotation and lateral flexion   Self care/Pt. Education:     HEP , need for compliance. Improvements with PT.          PT Long Term Goals - 05/11/21 1029       PT LONG TERM GOAL #1   Title Pt will be I with strengthening program for bilateral UEs , cervicals and posture    Status On-going      PT LONG TERM GOAL #2   Title Pt will be able to reach across her body for ADLs without Rt shoulder pain    Status Achieved      PT LONG TERM GOAL #3   Title Pt will be able to increase cervical AROM in all planes by 10 deg or more in order to drive, interact with environment    Status Unable to assess      PT LONG TERM GOAL #4   Title Pt will improve FOTO to 62 % in neck and > 70% in shoulder    Baseline emailed to pt.    Status Unable to assess                   Plan -  05/11/21 0939     Clinical Impression Statement Patient with improvement in cervical ROM and pain overall  She can usually perform her ADLs without increasing pain.  However, she admits to not doing her HEP, high stress and demands at home.  She committed to do x 1 before next visit.  She would like to finish her POC as she is feeling much beter in general, no pain at rest most of the time.    PT Treatment/Interventions Cryotherapy;Therapeutic exercise;Patient/family education;Manual techniques;Passive range of motion;Moist Heat;Iontophoresis 4mg /ml Dexamethasone;Electrical Stimulation;Ultrasound;Functional mobility training;Therapeutic activities;Neuromuscular re-education;Taping    PT Next Visit Plan FOTO and goals next visit, Strength    PT Home Exercise Plan Access Code [email or web address]    Consulted and Agree with Plan of Care Patient             Patient will benefit from skilled therapeutic intervention in order to improve the following deficits and impairments:  Decreased mobility, Hypomobility, Decreased range of motion, Decreased strength, Increased fascial restricitons, Impaired flexibility, Impaired UE functional use, Pain, Postural dysfunction  Visit  Diagnosis: Cervicalgia  Abnormal posture  Chronic right shoulder pain     Problem List Patient Active Problem List   Diagnosis Date Noted   Degenerative cervical disc 03/29/2021   AC (acromioclavicular) arthritis 03/29/2021   Left shoulder pain 05/27/2019   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

## 2021-05-15 ENCOUNTER — Ambulatory Visit: Payer: Medicare HMO | Admitting: Physical Therapy

## 2021-05-15 ENCOUNTER — Other Ambulatory Visit: Payer: Self-pay

## 2021-05-15 DIAGNOSIS — G8929 Other chronic pain: Secondary | ICD-10-CM

## 2021-05-15 DIAGNOSIS — R293 Abnormal posture: Secondary | ICD-10-CM | POA: Diagnosis not present

## 2021-05-15 DIAGNOSIS — M542 Cervicalgia: Secondary | ICD-10-CM | POA: Diagnosis not present

## 2021-05-15 DIAGNOSIS — M25511 Pain in right shoulder: Secondary | ICD-10-CM | POA: Diagnosis not present

## 2021-05-15 NOTE — Therapy (Signed)
[facility] [address] [place], [place], [address] Phone: [phone]   Fax:  [phone]  Physical Therapy Treatment  Patient Details  Name: Kara [name] MRN: [id number] Date of Birth: 09-12-1945 Referring Provider (PT): Dr. Antoine Primas   Encounter Date: 05/15/2021   PT End of Session - 05/15/21 0942     Visit Number 7    Number of Visits 12    Date for PT Re-Evaluation 05/23/21    Authorization Type Aetna MCR    PT Start Time 0930    PT Stop Time 1014    PT Time Calculation (min) 44 min    Activity Tolerance Patient tolerated treatment well    Behavior During Therapy [facility] for tasks assessed/performed             Past Medical History:  Diagnosis Date   Acid reflux    GERD (gastroesophageal reflux disease)    Hypercholesteremia    Hyperlipidemia     Past Surgical History:  Procedure Laterality Date   HERNIA REPAIR      There were no vitals filed for this visit.   Skilled therapy interventions:     Therapeutic Exercise: Nustep L6 UE and LE  5 min Wall angels (goal post) ball at thoracic spine  doing better with range improved ER and IR 2 lbs x 20  on Goal post chest level abduction 2 lbs x 10  on physioball Towel assisted rotation x 5 each with 5 sec hold   Upper trap stretch 2 x 30 sec , levator scapula x 2 x 30 sec    Overhead reaching bilateral UEs and alternating with chin tuck and core engagement  Lower trunk rotation with head turns x 10      Manual therapy  Soft tissue to posterior cervicals Light distraction  PROM in rotation and lateral flexion    Self care/Pt. Education:     reinforced HEP , need for compliance. Improvements with PT. And exercise in session with exercise   FOTO report 72% neck and shoulder 72% improved from eval Community wellness opportunities    [facility] PT Assessment - 05/15/21 0001       Observation/Other Assessments   Focus on Therapeutic Outcomes  (FOTO)  Neck 72 % Shoulder 72 %      AROM   Cervical Flexion 54    Cervical Extension 42    Cervical - Right Side Bend 30    Cervical - Left Side Bend 22    Cervical - Right Rotation 51    Cervical - Left Rotation 50                                         PT Long Term Goals - 05/15/21 1145       PT LONG TERM GOAL #1   Title Pt will be I with strengthening program for bilateral UEs , cervicals and posture    Time 6    Period Weeks    Status On-going      PT LONG TERM GOAL #2   Title Pt will be able to reach across her body for ADLs without Rt shoulder pain    Baseline No pain reaching across body today in clinic    Time 6    Period Weeks    Status Achieved      PT LONG TERM GOAL #3  Title Pt will be able to increase cervical AROM in all planes by 10 deg or more in order to drive, interact with environment    Baseline See medical chart , noted improvements in rotation and flex/ext    Time 6    Period Weeks    Status Partially Met      PT LONG TERM GOAL #4   Title Pt will improve FOTO to 62 % in neck and > 70% in shoulder    Baseline 05-15-21 72% neck and 72% shoulder    Time 6    Period Weeks    Status Achieved                   Plan - 05/15/21 9150     Clinical Impression Statement Pt with improved AROM of neck especially rotation and flex/ext.  FOTO improved to 72% neck and 72% shld.  Pt had initially C-6 neck pain and was afraid to do exercises however she was able to do exercises and eliminated pain.  Pt was encouraged to used HEP and be consistent with utilizing for pain management and strength.  Pt has one more visit and she should be able to DC to HEP and enter community wellness program    Personal Factors and Comorbidities Age;Time since onset of injury/illness/exacerbation    Examination-Activity Limitations Dressing;Lift;Carry;Reach Overhead    Examination-Participation Restrictions Interpersonal  Relationship;Laundry;Community Activity;Driving    PT Treatment/Interventions Cryotherapy;Therapeutic exercise;Patient/family education;Manual techniques;Passive range of motion;Moist Heat;Iontophoresis 4mg /ml Dexamethasone;Electrical Stimulation;Ultrasound;Functional mobility training;Therapeutic activities;Neuromuscular re-education;Taping    PT Next Visit Plan  goals next visit, Strength  DC next visit   PT Home Exercise Plan Access Code [email or web address]    Consulted and Agree with Plan of Care Patient             Patient will benefit from skilled therapeutic intervention in order to improve the following deficits and impairments:  Decreased mobility, Hypomobility, Decreased range of motion, Decreased strength, Increased fascial restricitons, Impaired flexibility, Impaired UE functional use, Pain, Postural dysfunction  Visit Diagnosis: Cervicalgia  Abnormal posture  Chronic right shoulder pain     Problem List Patient Active Problem List   Diagnosis Date Noted   Degenerative cervical disc 03/29/2021   AC (acromioclavicular) arthritis 03/29/2021   Left shoulder pain 05/27/2019   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 [place], [place], [address] Phone: [phone]   Fax:  [phone]  Name: Kara [name] MRN: [id number] Date of Birth: [birth year]

## 2021-05-18 ENCOUNTER — Ambulatory Visit: Payer: Medicare HMO | Admitting: Physical Therapy

## 2021-05-18 ENCOUNTER — Other Ambulatory Visit: Payer: Self-pay

## 2021-05-18 DIAGNOSIS — M542 Cervicalgia: Secondary | ICD-10-CM | POA: Diagnosis not present

## 2021-05-18 DIAGNOSIS — G8929 Other chronic pain: Secondary | ICD-10-CM | POA: Diagnosis not present

## 2021-05-18 DIAGNOSIS — M25511 Pain in right shoulder: Secondary | ICD-10-CM

## 2021-05-18 DIAGNOSIS — R293 Abnormal posture: Secondary | ICD-10-CM | POA: Diagnosis not present

## 2021-05-18 NOTE — Therapy (Signed)
[place], [place], [address] Phone: [phone]   Fax:  [phone]  Physical Therapy Treatment/Discharge   Patient Details  Name: Kara [name] MRN: [id number] Date of Birth: 25-Nov-1945 Referring Provider (PT): Dr. Hulan Saas   Encounter Date: 05/18/2021   PT End of Session - 05/18/21 1010     Visit Number 8    Number of Visits 12    Date for PT Re-Evaluation 05/23/21    Authorization Type [employer] MCR    PT Start Time 0935    PT Stop Time 1015    PT Time Calculation (min) 40 min    Activity Tolerance Patient tolerated treatment well    Behavior During Therapy [facility] for tasks assessed/performed             Past Medical History:  Diagnosis Date   Acid reflux    GERD (gastroesophageal reflux disease)    Hypercholesteremia    Hyperlipidemia     Past Surgical History:  Procedure Laterality Date   HERNIA REPAIR      There were no vitals filed for this visit.   Subjective Assessment - 05/18/21 0941     Subjective Feeling better overall.  I am pleased with my progress.  i dont have pain in my shoulder unless I reach back without warming up. Had some neck pain briefly Sunday. Feels ready for discharge .  If I felt like this I wouldnt go to the doctor.    Currently in Pain? No/denies                [facility] PT Assessment - 05/18/21 0001       AROM   Right Shoulder Internal Rotation --   FR to T11   Right Shoulder External Rotation --   FR to T1   Left Shoulder Internal Rotation --   FR to upper thoracic   Left Shoulder External Rotation --   FR to T2   Cervical Flexion 54    Cervical Extension 42    Cervical - Right Side Bend 30    Cervical - Left Side Bend 22    Cervical - Right Rotation 51    Cervical - Left Rotation 50      Strength   Right Shoulder Flexion 5/5    Right Shoulder ABduction 4+/5    Right Shoulder Internal Rotation 5/5    Right Shoulder External Rotation  4/5    Left Shoulder Flexion 5/5    Left Shoulder ABduction 4+/5               Skilled therapy interventions:   Therapeutic Exercise: NuStep LE and UE for 6 min  Standing row x 15 blue band  Standing extension x 15 blue band  Seated green theraband x 15 Seated ER/IR x 15 green band   Seated cervical side bending and rotational stretching 2 x 30 sec   Progress in PT, posture, goals  Self care/Pt. Education:    Walking program     PT Long Term Goals - 05/18/21 0939       PT LONG TERM GOAL #1   Title Pt will be I with strengthening program for bilateral UEs , cervicals and posture    Status Achieved      PT LONG TERM GOAL #2   Title Pt will be able to reach across her body for ADLs without Rt shoulder pain    Status Achieved      PT  LONG TERM GOAL #3   Title Pt will be able to increase cervical AROM in all planes by 10 deg or more in order to drive, interact with environment    Status Achieved      PT LONG TERM GOAL #4   Title Pt will improve FOTO to 62 % in neck and > 70% in shoulder    Baseline 05-15-21 72% neck and 72% shoulder    Status Achieved                   Plan - 05/18/21 0957     Clinical Impression Statement Patient has met her goals and feels ready for discharge.  She cont to have Rt shoulder discomfort with internal rotation intermittently. Very rare neck pain .  She is pleased with her progress and will aim to be consistent with HEP, walking program.    PT Treatment/Interventions Cryotherapy;Therapeutic exercise;Patient/family education;Manual techniques;Passive range of motion;Moist Heat;Iontophoresis 76m/ml Dexamethasone;Electrical Stimulation;Ultrasound;Functional mobility training;Therapeutic activities;Neuromuscular re-education;Taping    PT Next Visit Plan NA, DC    PT Home Exercise Plan Access Code [email or web address]    Consulted and Agree with Plan of Care Patient             Patient will benefit from skilled therapeutic  intervention in order to improve the following deficits and impairments:     Visit Diagnosis: Cervicalgia  Abnormal posture  Chronic right shoulder pain     Problem List Patient Active Problem List   Diagnosis Date Noted   Degenerative cervical disc 03/29/2021   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 Patient goals were met. Patient is being discharged due to meeting the stated rehab goals.  JRaeford Razor PT 05/18/21 10:22 AM Phone: [phone]: [phone]

## 2021-05-24 NOTE — Progress Notes (Signed)
[place] Phone: [phone] Subjective:   Kara [name], am serving as a scribe for Dr. Hulan Saas.  This visit occurred during the SARS-CoV-2 public health emergency.  Safety protocols were in place, including screening questions prior to the visit, additional usage of staff PPE, and extensive cleaning of exam room while observing appropriate contact time as indicated for disinfecting solutions.   I'm seeing this patient by the request  of:  Carol Ada, MD  CC: Left shoulder pain  [id number]  03/29/2021 Patient is having radicular symptoms that seems to be more degenerative cervical.  I do believe that the arthritic changes is likely contributing.  Patient does have some arthritic changes of the acromioclavicular joint as well.  We will start with formal physical therapy.  X-rays pending.  Started on low-dose of gabapentin at nighttime.  Discussed potential side effects.  Discussed icing regimen.  Follow-up with me again in 6 weeks.  Worsening pain consider injection in the acromioclavicular joint or potentially advanced imaging if any weakness occurs.  Right-sided.  Discussed with patient about icing regimen, home exercises, which activities to do which wants to avoid.  Increase activity slowly.  Patient will follow up with me again in 6 to 8 weeks  Update 05/28/2021 Kara [name] is a 75 y.o. female coming in with complaint of L shoulder pain.  There is concern that patient's left shoulder pain seems to be more secondary to possible degenerative cervical arthritis.  Patient also noted to have acromioclavicular arthritis.  Started with physical therapy.  Started also on a low-dose of gabapentin.  Patient states that her pain has improved. Patient has decreased gabapentin to 100mg  QHS. Patient had a bout of low BP while at physical therapy.       Past Medical History:  Diagnosis Date   Acid reflux     GERD (gastroesophageal reflux disease)    Hypercholesteremia    Hyperlipidemia    Past Surgical History:  Procedure Laterality Date   HERNIA REPAIR     Social History   Socioeconomic History   Marital status: Married    Spouse name: Not on file   Number of children: Not on file   Years of education: Not on file   Highest education level: Not on file  Occupational History   Not on file  Tobacco Use   Smoking status: Never   Smokeless tobacco: Never  Vaping Use   Vaping Use: Never used  Substance and Sexual Activity   Alcohol use: Yes    Comment: socially   Drug use: [name]   Sexual activity: Not on file  Other Topics Concern   Not on file  Social History Narrative   Not on file   Social Determinants of Health   Financial Resource Strain: Not on file  Food Insecurity: Not on file  Transportation Needs: Not on file  Physical Activity: Not on file  Stress: Not on file  Social Connections: Not on file   Allergies  Allergen Reactions   Amoxicillin Hives   Penicillins Hives   [name] family history on file.   Current Outpatient Medications (Cardiovascular):    rosuvastatin (CRESTOR) 20 MG tablet, Take 20 mg by mouth daily.     Current Outpatient Medications (Other):    ALPRAZolam (XANAX) 0.5 MG tablet, Take 0.5 mg by mouth daily as needed for anxiety.   diclofenac sodium (VOLTAREN) 1 % GEL, APPLY 2 GRAMS TOPICALLY 4  TIMES A DAY   gabapentin (NEURONTIN) 100 MG capsule, Take 2 capsules (200 mg total) by mouth at bedtime.   omeprazole (PRILOSEC) 20 MG capsule, Take 20 mg by mouth daily.   tetrahydrozoline 0.05 % ophthalmic solution, Place 1 drop into both eyes 4 (four) times daily as needed (for dry/irritated eyes).   Reviewed prior external information including notes and imaging from  primary care provider As well as notes that were available from care everywhere and other healthcare systems.  Past medical history, social, surgical and family history all reviewed  in electronic medical record.  [name] pertanent information unless stated regarding to the chief complaint.   Review of Systems:  [name] headache, visual changes, nausea, vomiting, diarrhea, constipation, dizziness, abdominal pain, skin rash, fevers, chills, night sweats, weight loss, swollen lymph nodes, body aches, joint swelling, chest pain, shortness of breath, mood changes. POSITIVE muscle aches  Objective  Blood pressure (!) 122/92, pulse 84, height 5\' 3"  (1.6 m), weight 171 lb (77.6 kg), SpO2 97 %.   General: [name] apparent distress alert and oriented x3 mood and affect normal, dressed appropriately.  HEENT: Pupils equal, extraocular movements intact  Respiratory: Patient's speak in full sentences and does not appear short of breath  Cardiovascular: [name] lower extremity edema, non tender, [name] erythema  Gait normal with good balance and coordination.  MSK: Arthritic changes noted.  Seems to be more still loss of range of motion of the neck.  Patient does have positive crossover of the shoulders bilaterally.    Impression and Recommendations:    The above documentation has been reviewed and is accurate and complete Lyndal Pulley, DO

## 2021-05-28 ENCOUNTER — Ambulatory Visit: Payer: Medicare HMO | Admitting: Family Medicine

## 2021-05-28 ENCOUNTER — Encounter: Payer: Self-pay | Admitting: Family Medicine

## 2021-05-28 ENCOUNTER — Ambulatory Visit: Payer: Self-pay

## 2021-05-28 ENCOUNTER — Other Ambulatory Visit: Payer: Self-pay

## 2021-05-28 VITALS — BP 122/92 | HR 84 | Ht 63.0 in | Wt 171.0 lb

## 2021-05-28 DIAGNOSIS — M25512 Pain in left shoulder: Secondary | ICD-10-CM | POA: Diagnosis not present

## 2021-05-28 DIAGNOSIS — G8929 Other chronic pain: Secondary | ICD-10-CM

## 2021-05-28 DIAGNOSIS — M5030 Other cervical disc degeneration, unspecified cervical region: Secondary | ICD-10-CM

## 2021-05-28 DIAGNOSIS — M19011 Primary osteoarthritis, right shoulder: Secondary | ICD-10-CM | POA: Diagnosis not present

## 2021-05-28 NOTE — Assessment & Plan Note (Signed)
Patient is doing well with conservative therapy at this time.  No significant change in management.  Did discuss with patient that if worsening symptoms can consider potential injection.  We will continue to monitor.

## 2021-05-28 NOTE — Assessment & Plan Note (Signed)
Patient also has degenerative disc disease of the cervical spine.  Patient has responded well to the gabapentin and is only doing 100 mg.  Discussed patient can titrate up all the way to 300 if necessary.  Discussed with patient icing regimen otherwise.  Patient is making progress with conservative therapy and will follow up again in 6 to 8 weeks

## 2021-05-28 NOTE — Patient Instructions (Signed)
Stay active Making great progress See you again in 6-8 weeks

## 2021-07-17 NOTE — Progress Notes (Signed)
[place] Phone: [phone] Subjective:   [name], am serving as a scribe for Dr. Hulan Saas.  This visit occurred during the SARS-CoV-2 public health emergency.  Safety protocols were in place, including screening questions prior to the visit, additional usage of staff PPE, and extensive cleaning of exam room while observing appropriate contact time as indicated for disinfecting solutions.    I'm seeing this patient by the request  of:  Carol Ada, MD  CC: Neck pain follow-up  [id number]  05/28/2021 Patient also has degenerative disc disease of the cervical spine.  Patient has responded well to the gabapentin and is only doing 100 mg.  Discussed patient can titrate up all the way to 300 if necessary.  Discussed with patient icing regimen otherwise.  Patient is making progress with conservative therapy and will follow up again in 6 to 8 weeks  Update 07/18/2021 Kara [name] is a 75 y.o. female coming in with complaint of neck and L shoulder pain. Patient states that she has been doing well. Using 100mg  at night. Notices wrapping gifts has increased her pain.  Patient states taking the 200 mg of gabapentin when she was in pain helped out significantly.  Did not have any more of the low blood pressure that she did send a MyChart message about.  Overall states that it does help with some of her daily activities.  Patient has had some difficulty still trying to stay active but more secondary to some stress with her husband having dementia.      Past Medical History:  Diagnosis Date   Acid reflux    GERD (gastroesophageal reflux disease)    Hypercholesteremia    Hyperlipidemia    Past Surgical History:  Procedure Laterality Date   HERNIA REPAIR     Social History   Socioeconomic History   Marital status: Married    Spouse name: Not on file   Number of children: Not on file   Years of  education: Not on file   Highest education level: Not on file  Occupational History   Not on file  Tobacco Use   Smoking status: Never   Smokeless tobacco: Never  Vaping Use   Vaping Use: Never used  Substance and Sexual Activity   Alcohol use: Yes    Comment: socially   Drug use: No   Sexual activity: Not on file  Other Topics Concern   Not on file  Social History Narrative   Not on file   Social Determinants of Health   Financial Resource Strain: Not on file  Food Insecurity: Not on file  Transportation Needs: Not on file  Physical Activity: Not on file  Stress: Not on file  Social Connections: Not on file   Allergies  Allergen Reactions   Amoxicillin Hives   Penicillins Hives   No family history on file.   Current Outpatient Medications (Cardiovascular):    rosuvastatin (CRESTOR) 20 MG tablet, Take 20 mg by mouth daily.     Current Outpatient Medications (Other):    ALPRAZolam (XANAX) 0.5 MG tablet, Take 0.5 mg by mouth daily as needed for anxiety.   diclofenac sodium (VOLTAREN) 1 % GEL, APPLY 2 GRAMS TOPICALLY 4 TIMES A DAY   gabapentin (NEURONTIN) 100 MG capsule, Take 2 capsules (200 mg total) by mouth at bedtime.   omeprazole (PRILOSEC) 20 MG capsule, Take 20 mg by mouth daily.   tetrahydrozoline 0.05 %  ophthalmic solution, Place 1 drop into both eyes 4 (four) times daily as needed (for dry/irritated eyes).     Objective  Blood pressure 132/80, pulse 62, height 5\' 3"  (1.6 m), weight 171 lb (77.6 kg), SpO2 98 %.   General: No apparent distress alert and oriented x3 mood and affect normal, dressed appropriately.  HEENT: Pupils equal, extraocular movements intact  Respiratory: Patient's speak in full sentences and does not appear short of breath  Cardiovascular: No lower extremity edema, non tender, no erythema  Gait normal with good balance and coordination.  MSK: Neck exam does have some loss of lordosis and does have some limited range of motion.   Patient does have some weakness noted of the posterior shoulder girdles noted.    Impression and Recommendations:     The above documentation has been reviewed and is accurate and complete Lyndal Pulley, DO

## 2021-07-18 ENCOUNTER — Other Ambulatory Visit: Payer: Self-pay

## 2021-07-18 ENCOUNTER — Ambulatory Visit: Payer: Medicare HMO | Admitting: Family Medicine

## 2021-07-18 DIAGNOSIS — M5030 Other cervical disc degeneration, unspecified cervical region: Secondary | ICD-10-CM

## 2021-07-18 NOTE — Patient Instructions (Signed)
Can continue to take 100-200mg  of gabapentin when needed Be active but do not stay in same position for too long See me in 2-3 months

## 2021-07-18 NOTE — Assessment & Plan Note (Signed)
Patient is doing significantly better with the gabapentin at this point.  Not having significant radicular symptoms.  Sleeping more comfortably at night.  Patient did have a episode of hypotension she states.  Feels like it is more secondary to dehydration and has not had another possibility at this point.  Increase activity slowly.  Follow-up with me again 2 to 3 months.

## 2021-08-10 DIAGNOSIS — R051 Acute cough: Secondary | ICD-10-CM | POA: Diagnosis not present

## 2021-08-10 DIAGNOSIS — R0981 Nasal congestion: Secondary | ICD-10-CM | POA: Diagnosis not present

## 2021-08-13 ENCOUNTER — Telehealth: Payer: Self-pay

## 2021-08-13 ENCOUNTER — Other Ambulatory Visit: Payer: Self-pay

## 2021-08-13 MED ORDER — GABAPENTIN 100 MG PO CAPS: 200.0000 mg | ORAL_CAPSULE | Freq: Every day | ORAL | 0 refills | Status: DC

## 2021-08-13 NOTE — Telephone Encounter (Signed)
Rx filled

## 2021-08-13 NOTE — Telephone Encounter (Signed)
Patient called and would like a refill of her Gabapenin set to [facility] [address]

## 2021-08-14 DIAGNOSIS — R052 Subacute cough: Secondary | ICD-10-CM | POA: Diagnosis not present

## 2021-08-14 DIAGNOSIS — K219 Gastro-esophageal reflux disease without esophagitis: Secondary | ICD-10-CM | POA: Diagnosis not present

## 2021-08-14 DIAGNOSIS — D582 Other hemoglobinopathies: Secondary | ICD-10-CM | POA: Diagnosis not present

## 2021-08-14 DIAGNOSIS — E7800 Pure hypercholesterolemia, unspecified: Secondary | ICD-10-CM | POA: Diagnosis not present

## 2021-08-14 DIAGNOSIS — R69 Illness, unspecified: Secondary | ICD-10-CM | POA: Diagnosis not present

## 2021-09-14 DIAGNOSIS — D485 Neoplasm of uncertain behavior of skin: Secondary | ICD-10-CM | POA: Diagnosis not present

## 2021-09-14 DIAGNOSIS — D2262 Melanocytic nevi of left upper limb, including shoulder: Secondary | ICD-10-CM | POA: Diagnosis not present

## 2021-09-14 DIAGNOSIS — Z85828 Personal history of other malignant neoplasm of skin: Secondary | ICD-10-CM | POA: Diagnosis not present

## 2021-09-14 DIAGNOSIS — D2271 Melanocytic nevi of right lower limb, including hip: Secondary | ICD-10-CM | POA: Diagnosis not present

## 2021-09-14 DIAGNOSIS — D220 Melanocytic nevi of lip: Secondary | ICD-10-CM | POA: Diagnosis not present

## 2021-09-14 DIAGNOSIS — D1801 Hemangioma of skin and subcutaneous tissue: Secondary | ICD-10-CM | POA: Diagnosis not present

## 2021-09-14 DIAGNOSIS — L821 Other seborrheic keratosis: Secondary | ICD-10-CM | POA: Diagnosis not present

## 2021-09-14 DIAGNOSIS — D2261 Melanocytic nevi of right upper limb, including shoulder: Secondary | ICD-10-CM | POA: Diagnosis not present

## 2021-09-26 ENCOUNTER — Ambulatory Visit: Payer: Medicare HMO | Admitting: Family Medicine

## 2021-10-12 NOTE — Progress Notes (Unsigned)
[place] Phone: [phone] Subjective:    I'm seeing this patient by the request  of:  Carol Ada, MD  CC:   [id number]  07/18/2021 Patient is doing significantly better with the gabapentin at this point.  Not having significant radicular symptoms.  Sleeping more comfortably at night.  Patient did have a episode of hypotension she states.  Feels like it is more secondary to dehydration and has not had another possibility at this point.  Increase activity slowly.  Follow-up with me again 2 to 3 months.  Update 10/16/2021 TRANG BOUSE is a 76 y.o. female coming in with complaint of cervical spine pain. Patient states      Past Medical History:  Diagnosis Date   Acid reflux    GERD (gastroesophageal reflux disease)    Hypercholesteremia    Hyperlipidemia    Past Surgical History:  Procedure Laterality Date   HERNIA REPAIR     Social History   Socioeconomic History   Marital status: Married    Spouse name: Not on file   Number of children: Not on file   Years of education: Not on file   Highest education level: Not on file  Occupational History   Not on file  Tobacco Use   Smoking status: Never   Smokeless tobacco: Never  Vaping Use   Vaping Use: Never used  Substance and Sexual Activity   Alcohol use: Yes    Comment: socially   Drug use: No   Sexual activity: Not on file  Other Topics Concern   Not on file  Social History Narrative   Not on file   Social Determinants of Health   Financial Resource Strain: Not on file  Food Insecurity: Not on file  Transportation Needs: Not on file  Physical Activity: Not on file  Stress: Not on file  Social Connections: Not on file   Allergies  Allergen Reactions   Amoxicillin Hives   Penicillins Hives   No family history on file.   Current Outpatient Medications (Cardiovascular):    rosuvastatin (CRESTOR) 20 MG tablet, Take 20  mg by mouth daily.     Current Outpatient Medications (Other):    ALPRAZolam (XANAX) 0.5 MG tablet, Take 0.5 mg by mouth daily as needed for anxiety.   diclofenac sodium (VOLTAREN) 1 % GEL, APPLY 2 GRAMS TOPICALLY 4 TIMES A DAY   gabapentin (NEURONTIN) 100 MG capsule, Take 2 capsules (200 mg total) by mouth at bedtime.   omeprazole (PRILOSEC) 20 MG capsule, Take 20 mg by mouth daily.   tetrahydrozoline 0.05 % ophthalmic solution, Place 1 drop into both eyes 4 (four) times daily as needed (for dry/irritated eyes).   Reviewed prior external information including notes and imaging from  primary care provider As well as notes that were available from care everywhere and other healthcare systems.  Past medical history, social, surgical and family history all reviewed in electronic medical record.  No pertanent information unless stated regarding to the chief complaint.   Review of Systems:  No headache, visual changes, nausea, vomiting, diarrhea, constipation, dizziness, abdominal pain, skin rash, fevers, chills, night sweats, weight loss, swollen lymph nodes, body aches, joint swelling, chest pain, shortness of breath, mood changes. POSITIVE muscle aches  Objective  There were no vitals taken for this visit.   General: No apparent distress alert and oriented x3 mood and affect normal, dressed appropriately.  HEENT: Pupils equal, extraocular movements intact  Respiratory: Patient's speak in full sentences and does not appear short of breath  Cardiovascular: No lower extremity edema, non tender, no erythema  Gait normal with good balance and coordination.  MSK:  Non tender with full range of motion and good stability and symmetric strength and tone of shoulders, elbows, wrist, hip, knee and ankles bilaterally.     Impression and Recommendations:     The above documentation has been reviewed and is accurate and complete Jacqualin Combes

## 2021-10-16 ENCOUNTER — Other Ambulatory Visit: Payer: Self-pay

## 2021-10-16 ENCOUNTER — Ambulatory Visit: Payer: Medicare HMO | Admitting: Family Medicine

## 2021-10-16 DIAGNOSIS — M5030 Other cervical disc degeneration, unspecified cervical region: Secondary | ICD-10-CM

## 2021-10-16 NOTE — Patient Instructions (Addendum)
Good to see you  ?[facility] another [facility] because that smells hideous ?If anyone needs a refill please please mychart me ?Monitor the wrist but I think it will be okay  ?Follow up in 3-4 months  ?

## 2021-10-16 NOTE — Assessment & Plan Note (Signed)
Patient is significantly doing better at this time.  Has even been off the gabapentin.  The patient though does like to have it and it does help her with sleep.  We discussed trying to give another refill that would have a different generic than the one with the foul smell that she brought in today.  Discussed icing regimen and home exercises otherwise.  Increase activity slowly.  Discussed which activities to do and which ones to avoid.  Increase activity and follow-up with me again in 2 to 3 months ?

## 2022-01-30 ENCOUNTER — Ambulatory Visit: Payer: Medicare HMO | Admitting: Family Medicine

## 2022-01-30 DIAGNOSIS — M5030 Other cervical disc degeneration, unspecified cervical region: Secondary | ICD-10-CM | POA: Diagnosis not present

## 2022-01-30 DIAGNOSIS — M654 Radial styloid tenosynovitis [de Quervain]: Secondary | ICD-10-CM

## 2022-01-30 MED ORDER — GABAPENTIN 100 MG PO CAPS: 200.0000 mg | ORAL_CAPSULE | Freq: Every day | ORAL | 0 refills | Status: DC

## 2022-01-30 NOTE — Patient Instructions (Signed)
Brace at night for 2 weeks Gabapentin refilled [place] with repetitive activity See you again in 3 months

## 2022-01-30 NOTE — Progress Notes (Unsigned)
Kara [name] [address] [place] Phone: [phone] Subjective:   [name], am serving as a scribe for Dr. Hulan Saas.  I'm seeing this patient by the request  of:  Carol Ada, MD  CC: Neck pain left wrist pain follow-up  BMW:[id number]  10/16/2021 Patient is significantly doing better at this time.  Has even been off the gabapentin.  The patient though does like to have it and it does help her with sleep.  We discussed trying to give another refill that would have a different generic than the one with the foul smell that she brought in today.  Discussed icing regimen and home exercises otherwise.  Increase activity slowly.  Discussed which activities to do and which ones to avoid.  Increase activity and follow-up with me again in 2 to 3 months  Update 01/30/2022 Kara [name] is a 76 y.o. female coming in with complaint of cervical spine pain. Patient states pain little to none. Left wrist pain. Wants to talk about the cyst and gabapentin.      Past Medical History:  Diagnosis Date   Acid reflux    GERD (gastroesophageal reflux disease)    Hypercholesteremia    Hyperlipidemia    Past Surgical History:  Procedure Laterality Date   HERNIA REPAIR     Social History   Socioeconomic History   Marital status: Married    Spouse name: Not on file   Number of children: Not on file   Years of education: Not on file   Highest education level: Not on file  Occupational History   Not on file  Tobacco Use   Smoking status: Never   Smokeless tobacco: Never  Vaping Use   Vaping Use: Never used  Substance and Sexual Activity   Alcohol use: Yes    Comment: socially   Drug use: No   Sexual activity: Not on file  Other Topics Concern   Not on file  Social History Narrative   Not on file   Social Determinants of Health   Financial Resource Strain: Not on file  Food Insecurity: Not on file  Transportation Needs:  Not on file  Physical Activity: Not on file  Stress: Not on file  Social Connections: Not on file   Allergies  Allergen Reactions   Amoxicillin Hives   Penicillins Hives   No family history on file.   Current Outpatient Medications (Cardiovascular):    rosuvastatin (CRESTOR) 20 MG tablet, Take 20 mg by mouth daily.     Current Outpatient Medications (Other):    ALPRAZolam (XANAX) 0.5 MG tablet, Take 0.5 mg by mouth daily as needed for anxiety.   diclofenac sodium (VOLTAREN) 1 % GEL, APPLY 2 GRAMS TOPICALLY 4 TIMES A DAY   gabapentin (NEURONTIN) 100 MG capsule, Take 2 capsules (200 mg total) by mouth at bedtime.   omeprazole (PRILOSEC) 20 MG capsule, Take 20 mg by mouth daily.   tetrahydrozoline 0.05 % ophthalmic solution, Place 1 drop into both eyes 4 (four) times daily as needed (for dry/irritated eyes).   Reviewed prior external information including notes and imaging from  primary care provider As well as notes that were available from care everywhere and other healthcare systems.  Past medical history, social, surgical and family history all reviewed in electronic medical record.  No pertanent information unless stated regarding to the chief complaint.   Review of Systems:  No headache, visual changes, nausea, vomiting, diarrhea, constipation, dizziness,  abdominal pain, skin rash, fevers, chills, night sweats, weight loss, swollen lymph nodes, body aches, joint swelling, chest pain, shortness of breath, mood changes. POSITIVE muscle aches  Objective  Blood pressure 116/74, pulse 69, height '5\' 3"'$  (1.6 m), weight 171 lb (77.6 kg), SpO2 97 %.   General: No apparent distress alert and oriented x3 mood and affect normal, dressed appropriately.  HEENT: Pupils equal, extraocular movements intact  Respiratory: Patient's speak in full sentences and does not appear short of breath  Cardiovascular: No lower extremity edema, non tender, no erythema  Neck exam does have some loss of  lordosis.  Significant limited sidebending bilaterally negative Spurling's.  Patient does have arthritic changes noted of the neck as well as the shoulders.  Good strength of the rotator cuff noted Left wrist exam shows the patient does have a positive Finkelstein.  Does have swelling a little bit over the TFCC and some discomfort there as well.   [id number]; 15 additional minutes spent for Therapeutic exercises as stated in above notes.  This included exercises focusing on stretching, strengthening, with significant focus on eccentric aspects.   Long term goals include an improvement in range of motion, strength, endurance as well as avoiding reinjury. Patient's frequency would include in 1-2 times a day, 3-5 times a week for a duration of 6-12 weeks.  Proper technique shown and discussed handout in great detail with ATC.  All questions were discussed and answered.     Impression and Recommendations:     The above documentation has been reviewed and is accurate and complete Lyndal Pulley, DO

## 2022-01-31 DIAGNOSIS — M654 Radial styloid tenosynovitis [de Quervain]: Secondary | ICD-10-CM | POA: Insufficient documentation

## 2022-01-31 NOTE — Assessment & Plan Note (Signed)
Chronic problem but stable overall.  Does have the gabapentin.  Discussed continuing the home exercises.  Worsening pain can consider advanced imaging but patient is doing well with conservative therapy.  Follow-up with me again in 2 to 3 months

## 2022-01-31 NOTE — Assessment & Plan Note (Signed)
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  This anatomy was reviewed with the patient.  Ice bid for 2-3 days at a minimum Place the thumb in a thumb spica splint Hand exercises in a week or so - stress ball then opening hand with rubber bands  If continues to do poorly, may need to inject the 1st dorsal sheath

## 2022-02-13 DIAGNOSIS — R69 Illness, unspecified: Secondary | ICD-10-CM | POA: Diagnosis not present

## 2022-02-13 DIAGNOSIS — K219 Gastro-esophageal reflux disease without esophagitis: Secondary | ICD-10-CM | POA: Diagnosis not present

## 2022-02-13 DIAGNOSIS — Z0000 Encounter for general adult medical examination without abnormal findings: Secondary | ICD-10-CM | POA: Diagnosis not present

## 2022-02-13 DIAGNOSIS — Z23 Encounter for immunization: Secondary | ICD-10-CM | POA: Diagnosis not present

## 2022-02-13 DIAGNOSIS — F439 Reaction to severe stress, unspecified: Secondary | ICD-10-CM | POA: Diagnosis not present

## 2022-02-13 DIAGNOSIS — Z1331 Encounter for screening for depression: Secondary | ICD-10-CM | POA: Diagnosis not present

## 2022-02-13 DIAGNOSIS — E7800 Pure hypercholesterolemia, unspecified: Secondary | ICD-10-CM | POA: Diagnosis not present

## 2022-04-18 ENCOUNTER — Other Ambulatory Visit: Payer: Self-pay | Admitting: Family Medicine

## 2022-04-18 DIAGNOSIS — Z1231 Encounter for screening mammogram for malignant neoplasm of breast: Secondary | ICD-10-CM

## 2022-04-22 DIAGNOSIS — H5213 Myopia, bilateral: Secondary | ICD-10-CM | POA: Diagnosis not present

## 2022-04-22 DIAGNOSIS — H2513 Age-related nuclear cataract, bilateral: Secondary | ICD-10-CM | POA: Diagnosis not present

## 2022-05-01 ENCOUNTER — Ambulatory Visit: Payer: Medicare HMO | Admitting: Family Medicine

## 2022-05-23 ENCOUNTER — Ambulatory Visit: Payer: Medicare HMO

## 2022-06-20 DIAGNOSIS — H2511 Age-related nuclear cataract, right eye: Secondary | ICD-10-CM | POA: Diagnosis not present

## 2022-06-20 DIAGNOSIS — H21561 Pupillary abnormality, right eye: Secondary | ICD-10-CM | POA: Diagnosis not present

## 2022-06-20 DIAGNOSIS — H269 Unspecified cataract: Secondary | ICD-10-CM | POA: Diagnosis not present

## 2022-08-22 ENCOUNTER — Ambulatory Visit
Admission: RE | Admit: 2022-08-22 | Discharge: 2022-08-22 | Disposition: A | Discharge status: Home / Self Care | Payer: Medicare HMO | Source: Ambulatory Visit | Attending: Family Medicine | Admitting: Family Medicine

## 2022-08-22 DIAGNOSIS — Z1231 Encounter for screening mammogram for malignant neoplasm of breast: Secondary | ICD-10-CM

## 2022-11-06 DIAGNOSIS — Z85828 Personal history of other malignant neoplasm of skin: Secondary | ICD-10-CM | POA: Diagnosis not present

## 2022-11-06 DIAGNOSIS — D1801 Hemangioma of skin and subcutaneous tissue: Secondary | ICD-10-CM | POA: Diagnosis not present

## 2022-11-06 DIAGNOSIS — D2262 Melanocytic nevi of left upper limb, including shoulder: Secondary | ICD-10-CM | POA: Diagnosis not present

## 2022-11-06 DIAGNOSIS — D2271 Melanocytic nevi of right lower limb, including hip: Secondary | ICD-10-CM | POA: Diagnosis not present

## 2022-11-06 DIAGNOSIS — L821 Other seborrheic keratosis: Secondary | ICD-10-CM | POA: Diagnosis not present

## 2022-11-06 DIAGNOSIS — L245 Irritant contact dermatitis due to other chemical products: Secondary | ICD-10-CM | POA: Diagnosis not present

## 2022-11-06 DIAGNOSIS — D225 Melanocytic nevi of trunk: Secondary | ICD-10-CM | POA: Diagnosis not present

## 2023-02-25 DIAGNOSIS — D582 Other hemoglobinopathies: Secondary | ICD-10-CM | POA: Diagnosis not present

## 2023-02-25 DIAGNOSIS — K219 Gastro-esophageal reflux disease without esophagitis: Secondary | ICD-10-CM | POA: Diagnosis not present

## 2023-02-25 DIAGNOSIS — E7800 Pure hypercholesterolemia, unspecified: Secondary | ICD-10-CM | POA: Diagnosis not present

## 2023-02-25 DIAGNOSIS — Z1211 Encounter for screening for malignant neoplasm of colon: Secondary | ICD-10-CM | POA: Diagnosis not present

## 2023-02-25 DIAGNOSIS — Z0000 Encounter for general adult medical examination without abnormal findings: Secondary | ICD-10-CM | POA: Diagnosis not present

## 2023-02-25 DIAGNOSIS — E2839 Other primary ovarian failure: Secondary | ICD-10-CM | POA: Diagnosis not present

## 2023-02-25 DIAGNOSIS — Z1331 Encounter for screening for depression: Secondary | ICD-10-CM | POA: Diagnosis not present

## 2023-02-26 ENCOUNTER — Other Ambulatory Visit: Payer: Self-pay | Admitting: Family Medicine

## 2023-02-26 DIAGNOSIS — E2839 Other primary ovarian failure: Secondary | ICD-10-CM

## 2023-03-13 DIAGNOSIS — H6121 Impacted cerumen, right ear: Secondary | ICD-10-CM | POA: Diagnosis not present

## 2023-03-13 DIAGNOSIS — R109 Unspecified abdominal pain: Secondary | ICD-10-CM | POA: Diagnosis not present

## 2023-04-02 DIAGNOSIS — H938X1 Other specified disorders of right ear: Secondary | ICD-10-CM | POA: Diagnosis not present

## 2023-04-02 DIAGNOSIS — R29818 Other symptoms and signs involving the nervous system: Secondary | ICD-10-CM | POA: Diagnosis not present

## 2023-04-02 DIAGNOSIS — R5383 Other fatigue: Secondary | ICD-10-CM | POA: Diagnosis not present

## 2023-04-02 DIAGNOSIS — R110 Nausea: Secondary | ICD-10-CM | POA: Diagnosis not present

## 2023-04-15 ENCOUNTER — Other Ambulatory Visit: Payer: Self-pay | Admitting: Family Medicine

## 2023-04-15 DIAGNOSIS — R42 Dizziness and giddiness: Secondary | ICD-10-CM | POA: Diagnosis not present

## 2023-04-15 DIAGNOSIS — R110 Nausea: Secondary | ICD-10-CM | POA: Diagnosis not present

## 2023-04-15 DIAGNOSIS — R29818 Other symptoms and signs involving the nervous system: Secondary | ICD-10-CM | POA: Diagnosis not present

## 2023-05-05 ENCOUNTER — Encounter: Payer: Self-pay | Admitting: Family Medicine

## 2023-05-06 ENCOUNTER — Ambulatory Visit
Admission: RE | Admit: 2023-05-06 | Discharge: 2023-05-06 | Disposition: A | Discharge status: Home / Self Care | Payer: Medicare HMO | Source: Ambulatory Visit | Attending: Family Medicine | Admitting: Family Medicine

## 2023-05-06 DIAGNOSIS — I6782 Cerebral ischemia: Secondary | ICD-10-CM | POA: Diagnosis not present

## 2023-05-06 DIAGNOSIS — R42 Dizziness and giddiness: Secondary | ICD-10-CM | POA: Diagnosis not present

## 2023-05-06 MED ORDER — GADOPICLENOL 0.5 MMOL/ML IV SOLN
7.5000 mL | Freq: Once | INTRAVENOUS | Status: AC | PRN
  Administered 2023-05-06: 7.5 mL via INTRAVENOUS

## 2023-05-10 ENCOUNTER — Other Ambulatory Visit: Payer: Medicare HMO

## 2023-06-10 DIAGNOSIS — K5730 Diverticulosis of large intestine without perforation or abscess without bleeding: Secondary | ICD-10-CM | POA: Diagnosis not present

## 2023-06-10 DIAGNOSIS — Z09 Encounter for follow-up examination after completed treatment for conditions other than malignant neoplasm: Secondary | ICD-10-CM | POA: Diagnosis not present

## 2023-06-10 DIAGNOSIS — D122 Benign neoplasm of ascending colon: Secondary | ICD-10-CM | POA: Diagnosis not present

## 2023-06-10 DIAGNOSIS — D123 Benign neoplasm of transverse colon: Secondary | ICD-10-CM | POA: Diagnosis not present

## 2023-06-10 DIAGNOSIS — D124 Benign neoplasm of descending colon: Secondary | ICD-10-CM | POA: Diagnosis not present

## 2023-06-10 DIAGNOSIS — D120 Benign neoplasm of cecum: Secondary | ICD-10-CM | POA: Diagnosis not present

## 2023-06-10 DIAGNOSIS — Z860100 Personal history of colon polyps, unspecified: Secondary | ICD-10-CM | POA: Diagnosis not present

## 2023-06-12 DIAGNOSIS — D123 Benign neoplasm of transverse colon: Secondary | ICD-10-CM | POA: Diagnosis not present

## 2023-06-12 DIAGNOSIS — D120 Benign neoplasm of cecum: Secondary | ICD-10-CM | POA: Diagnosis not present

## 2023-06-12 DIAGNOSIS — D122 Benign neoplasm of ascending colon: Secondary | ICD-10-CM | POA: Diagnosis not present

## 2023-06-12 DIAGNOSIS — D124 Benign neoplasm of descending colon: Secondary | ICD-10-CM | POA: Diagnosis not present

## 2023-10-13 ENCOUNTER — Other Ambulatory Visit: Payer: Self-pay | Admitting: Family Medicine

## 2023-10-13 DIAGNOSIS — Z1231 Encounter for screening mammogram for malignant neoplasm of breast: Secondary | ICD-10-CM

## 2023-10-14 ENCOUNTER — Ambulatory Visit
Admission: RE | Admit: 2023-10-14 | Discharge: 2023-10-14 | Disposition: A | Discharge status: Home / Self Care | Source: Ambulatory Visit | Attending: Family Medicine | Admitting: Family Medicine

## 2023-10-14 DIAGNOSIS — Z1231 Encounter for screening mammogram for malignant neoplasm of breast: Secondary | ICD-10-CM

## 2023-11-14 ENCOUNTER — Other Ambulatory Visit: Payer: Self-pay | Admitting: Family Medicine

## 2023-11-14 DIAGNOSIS — E2839 Other primary ovarian failure: Secondary | ICD-10-CM

## 2023-11-21 ENCOUNTER — Other Ambulatory Visit: Payer: Medicare HMO

## 2024-01-03 DIAGNOSIS — E7800 Pure hypercholesterolemia, unspecified: Secondary | ICD-10-CM | POA: Diagnosis not present

## 2024-01-19 DIAGNOSIS — Z85828 Personal history of other malignant neoplasm of skin: Secondary | ICD-10-CM | POA: Diagnosis not present

## 2024-01-19 DIAGNOSIS — D2271 Melanocytic nevi of right lower limb, including hip: Secondary | ICD-10-CM | POA: Diagnosis not present

## 2024-01-19 DIAGNOSIS — D1801 Hemangioma of skin and subcutaneous tissue: Secondary | ICD-10-CM | POA: Diagnosis not present

## 2024-01-19 DIAGNOSIS — D485 Neoplasm of uncertain behavior of skin: Secondary | ICD-10-CM | POA: Diagnosis not present

## 2024-01-19 DIAGNOSIS — L821 Other seborrheic keratosis: Secondary | ICD-10-CM | POA: Diagnosis not present

## 2024-01-19 DIAGNOSIS — L820 Inflamed seborrheic keratosis: Secondary | ICD-10-CM | POA: Diagnosis not present

## 2024-02-02 DIAGNOSIS — E7800 Pure hypercholesterolemia, unspecified: Secondary | ICD-10-CM | POA: Diagnosis not present

## 2024-03-02 DIAGNOSIS — R110 Nausea: Secondary | ICD-10-CM | POA: Diagnosis not present

## 2024-03-02 DIAGNOSIS — Z79624 Long term (current) use of inhibitors of nucleotide synthesis: Secondary | ICD-10-CM | POA: Diagnosis not present

## 2024-03-02 DIAGNOSIS — R5383 Other fatigue: Secondary | ICD-10-CM | POA: Diagnosis not present

## 2024-03-02 DIAGNOSIS — R42 Dizziness and giddiness: Secondary | ICD-10-CM | POA: Diagnosis not present

## 2024-03-04 DIAGNOSIS — E7800 Pure hypercholesterolemia, unspecified: Secondary | ICD-10-CM | POA: Diagnosis not present

## 2024-03-17 DIAGNOSIS — R531 Weakness: Secondary | ICD-10-CM | POA: Diagnosis not present

## 2024-03-17 DIAGNOSIS — D582 Other hemoglobinopathies: Secondary | ICD-10-CM | POA: Diagnosis not present

## 2024-03-17 DIAGNOSIS — E7800 Pure hypercholesterolemia, unspecified: Secondary | ICD-10-CM | POA: Diagnosis not present

## 2024-03-17 DIAGNOSIS — Z0000 Encounter for general adult medical examination without abnormal findings: Secondary | ICD-10-CM | POA: Diagnosis not present

## 2024-03-17 DIAGNOSIS — K219 Gastro-esophageal reflux disease without esophagitis: Secondary | ICD-10-CM | POA: Diagnosis not present

## 2024-03-17 DIAGNOSIS — F439 Reaction to severe stress, unspecified: Secondary | ICD-10-CM | POA: Diagnosis not present

## 2024-03-17 DIAGNOSIS — R2689 Other abnormalities of gait and mobility: Secondary | ICD-10-CM | POA: Diagnosis not present

## 2024-03-17 DIAGNOSIS — Z1331 Encounter for screening for depression: Secondary | ICD-10-CM | POA: Diagnosis not present

## 2024-03-24 ENCOUNTER — Encounter: Payer: Self-pay | Admitting: Neurology

## 2024-04-04 DIAGNOSIS — E7800 Pure hypercholesterolemia, unspecified: Secondary | ICD-10-CM | POA: Diagnosis not present

## 2024-04-13 ENCOUNTER — Inpatient Hospital Stay: Attending: Hematology and Oncology

## 2024-04-13 ENCOUNTER — Inpatient Hospital Stay (HOSPITAL_BASED_OUTPATIENT_CLINIC_OR_DEPARTMENT_OTHER): Admitting: Hematology and Oncology

## 2024-04-13 VITALS — BP 110/32 | HR 78 | Temp 97.8°F | Resp 18 | Wt 167.1 lb

## 2024-04-13 DIAGNOSIS — D751 Secondary polycythemia: Secondary | ICD-10-CM | POA: Insufficient documentation

## 2024-04-13 DIAGNOSIS — R7989 Other specified abnormal findings of blood chemistry: Secondary | ICD-10-CM

## 2024-04-13 DIAGNOSIS — Z880 Allergy status to penicillin: Secondary | ICD-10-CM | POA: Diagnosis not present

## 2024-04-13 DIAGNOSIS — Z79899 Other long term (current) drug therapy: Secondary | ICD-10-CM | POA: Diagnosis not present

## 2024-04-13 DIAGNOSIS — M4682 Other specified inflammatory spondylopathies, cervical region: Secondary | ICD-10-CM

## 2024-04-13 DIAGNOSIS — R5383 Other fatigue: Secondary | ICD-10-CM | POA: Insufficient documentation

## 2024-04-13 LAB — CBC WITH DIFFERENTIAL (CANCER CENTER ONLY)
Abs Immature Granulocytes: 0.03 K/uL (ref 0.00–0.07)
Basophils Absolute: 0 K/uL (ref 0.0–0.1)
Basophils Relative: 1 %
Eosinophils Absolute: 0.1 K/uL (ref 0.0–0.5)
Eosinophils Relative: 1 %
HCT: 45.7 % (ref 36.0–46.0)
Hemoglobin: 16.4 g/dL — ABNORMAL HIGH (ref 12.0–15.0)
Immature Granulocytes: 0 %
Lymphocytes Relative: 29 %
Lymphs Abs: 2.3 K/uL (ref 0.7–4.0)
MCH: 31.1 pg (ref 26.0–34.0)
MCHC: 35.9 g/dL (ref 30.0–36.0)
MCV: 86.7 fL (ref 80.0–100.0)
Monocytes Absolute: 0.6 K/uL (ref 0.1–1.0)
Monocytes Relative: 7 %
Neutro Abs: 4.9 K/uL (ref 1.7–7.7)
Neutrophils Relative %: 62 %
Platelet Count: 231 K/uL (ref 150–400)
RBC: 5.27 MIL/uL — ABNORMAL HIGH (ref 3.87–5.11)
RDW: 12.5 % (ref 11.5–15.5)
WBC Count: 7.9 K/uL (ref 4.0–10.5)
nRBC: 0 % (ref 0.0–0.2)

## 2024-04-13 NOTE — Progress Notes (Signed)
 [facility] CONSULT NOTE  Patient Care Team: Claudene Pellet, MD as PCP - General (Family Medicine)  CHIEF COMPLAINTS/PURPOSE OF CONSULTATION:  Evaluation of elevated hemoglobin and ferritin, complains of severe fatigue and dullness in the head  HISTORY OF PRESENTING ILLNESS:    History of Present Illness Kara [name] is a 78 year old female who presents for evaluation of elevated hemoglobin and ferritin levels.  Hemoglobin levels have been elevated since 2019, consistently around 16 to 16.5 g/dL. She recalls elevated blood levels during her first pregnancy at age 2. Ferritin levels are elevated, with a recent measurement of approximately 330 ng/mL, while iron saturation remains normal.  Since mid-June, she experiences a sensation of toppling over, profound fatigue, and brain fog. These symptoms have improved somewhat, but the sensation of toppling over persists, especially when stopping walking. A similar episode occurred last summer, lasting about two months and resolving spontaneously. A brain MRI at that time showed no abnormalities. There is no weakness in the legs during these episodes and no significant changes in symptoms with rest or activity.    I reviewed her records extensively and collaborated the history with the patient.   MEDICAL HISTORY:  Past Medical History:  Diagnosis Date   Acid reflux    GERD (gastroesophageal reflux disease)    Hypercholesteremia    Hyperlipidemia     SURGICAL HISTORY: Past Surgical History:  Procedure Laterality Date   HERNIA REPAIR      SOCIAL HISTORY: Social History   Socioeconomic History   Marital status: Married    Spouse name: Not on file   Number of children: Not on file   Years of education: Not on file   Highest education level: Not on file  Occupational History   Not on file  Tobacco Use   Smoking status: Never   Smokeless tobacco: Never  Vaping Use   Vaping status: Never Used  Substance and  Sexual Activity   Alcohol use: Yes    Comment: socially   Drug use: No   Sexual activity: Not on file  Other Topics Concern   Not on file  Social History Narrative   Not on file   Social Drivers of Health   Financial Resource Strain: Not on file  Food Insecurity: No Food Insecurity (04/13/2024)   Hunger Vital Sign    Worried About Running Out of Food in the Last Year: Never true    Ran Out of Food in the Last Year: Never true  Transportation Needs: No Transportation Needs (04/13/2024)   PRAPARE - Administrator, Civil Service (Medical): No    Lack of Transportation (Non-Medical): No  Physical Activity: Not on file  Stress: Not on file  Social Connections: Not on file  Intimate Partner Violence: Not on file    FAMILY HISTORY: No family history on file.  ALLERGIES:  is allergic to amoxicillin and penicillins.  MEDICATIONS:  Current Outpatient Medications  Medication Sig Dispense Refill   ALPRAZolam (XANAX) 0.5 MG tablet Take 0.5 mg by mouth daily as needed for anxiety.     omeprazole (PRILOSEC) 20 MG capsule Take 20 mg by mouth daily.     rosuvastatin (CRESTOR) 20 MG tablet Take 20 mg by mouth daily.     No current facility-administered medications for this visit.    REVIEW OF SYSTEMS:   Constitutional: Denies fevers, chills or abnormal night sweats All other systems were reviewed with the patient and are negative.  PHYSICAL EXAMINATION: ECOG  PERFORMANCE STATUS: 1 - Symptomatic but completely ambulatory  Vitals:   04/13/24 1308  BP: (!) 110/32  Pulse: 78  Resp: 18  Temp: 97.8 F (36.6 C)  SpO2: 98%   Filed Weights   04/13/24 1308  Weight: 167 lb 1.6 oz (75.8 kg)    GENERAL:alert, no distress and comfortable  LABORATORY DATA:  I have reviewed the data as listed Lab Results  Component Value Date   WBC 8.8 10/24/2017   HGB 16.6 (H) 10/24/2017   HCT 48.5 (H) 10/24/2017   MCV 88.5 10/24/2017   PLT 236 10/24/2017   Lab Results  Component  Value Date   NA 141 10/24/2017   K 3.9 10/24/2017   CL 106 10/24/2017   CO2 25 10/24/2017    RADIOGRAPHIC STUDIES: I have personally reviewed the radiological reports and agreed with the findings in the report.  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  However I would like to send for JAK2 mutation analysis.  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 Previous workup suggested secondary polycythemia, cause unidentified. Differential includes primary polycythemia due to potential JAK2 mutation. Symptoms include fatigue and imbalance. - Order JAK2 mutation analysis to evaluate for primary polycythemia. - Schedule therapeutic phlebotomy to remove 300 ml of blood and assess symptomatic improvement. - Advise to drink plenty of water on the day of phlebotomy to reduce risk of fainting. - Recheck CBC in three months to monitor hemoglobin levels.  Elevated ferritin secondary to chronic inflammation (likely due to arthritis) Ferritin elevated at 332 ng/mL, normal iron saturation, indicating no excessive iron absorption. Likely due to chronic  inflammation, possibly from arthritis in neck and shoulder. Hemochromatosis ruled out. - Monitor ferritin levels as she may fluctuate with arthritis activity.    All questions were answered. The patient knows to call the clinic with any problems, questions or concerns.    Viinay K [name], MD 04/13/24

## 2024-04-13 NOTE — Assessment & Plan Note (Signed)
 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  However I would like to send for JAK2 mutation analysis.  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

## 2024-04-19 DIAGNOSIS — Z85828 Personal history of other malignant neoplasm of skin: Secondary | ICD-10-CM | POA: Diagnosis not present

## 2024-04-19 DIAGNOSIS — D485 Neoplasm of uncertain behavior of skin: Secondary | ICD-10-CM | POA: Diagnosis not present

## 2024-04-21 LAB — JAK2 V617F RFX CALR/MPL/E12-15

## 2024-04-21 LAB — CALR +MPL + E12-E15  (REFLEX)

## 2024-04-26 ENCOUNTER — Inpatient Hospital Stay

## 2024-04-26 VITALS — BP 131/81 | HR 62 | Temp 98.2°F | Resp 16

## 2024-04-26 DIAGNOSIS — D751 Secondary polycythemia: Secondary | ICD-10-CM | POA: Diagnosis not present

## 2024-04-26 NOTE — Patient Instructions (Signed)

## 2024-04-26 NOTE — Progress Notes (Signed)
 Kara [name] presents today for phlebotomy per MD orders. Phlebotomy procedure started at 1455 and clotted off at 1518. 390 grams removed. Okay to stop per Dr. Gudena.   Patient observed for 30 minutes after procedure without any incident. Patient tolerated procedure well. 16 G IV phlebotomy kit used in left AC.  IV needle removed intact.  Water provided, vital signs stable, and ambulatory to lobby.

## 2024-05-20 NOTE — Progress Notes (Unsigned)
 [facility] [address] [place] [address] Phone: [phone] Subjective:   ISusannah Gully, am serving as a scribe for Dr. Arthea Claudene.  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  This anatomy was reviewed with the patient.   Ice bid for 2-3 days at a minimum Place the thumb in a thumb spica splint Hand exercises in a week or so - stress ball then opening hand with rubber bands   If continues to do poorly, may need to inject the 1st dorsal sheath     Updated 05/24/2024 SHARY LAMOS is a 78 y.o. female coming in with complaint of upper back pain.  Known history of degenerative disc but also acromioclavicular arthritis. Early June felt unsteady. Legs were feeling tired and weak. Has gone to Hematology [place] 2 test conclusion inflammation from arthritis. Hasn't helped, has a neurology appointment coming up.       Past Medical History:  Diagnosis Date   Acid reflux    GERD (gastroesophageal reflux disease)    Hypercholesteremia    Hyperlipidemia    Past Surgical History:  Procedure Laterality Date   HERNIA REPAIR     Social History   Socioeconomic History   Marital status: Married    Spouse name: Not on file   Number of children: Not on file   Years of education: Not on file   Highest education level: Not on file  Occupational History   Not on file  Tobacco Use   Smoking status: Never   Smokeless tobacco: Never  Vaping Use   Vaping status: Never Used  Substance and Sexual Activity   Alcohol use: Yes    Comment: socially   Drug use: No   Sexual activity: Not on file  Other Topics Concern   Not on file  Social History Narrative   Not on file   Social Drivers of Health   Financial Resource Strain: Not on file  Food Insecurity: No Food Insecurity (04/13/2024)   Hunger Vital Sign    Worried  About Running Out of Food in the Last Year: Never true    Ran Out of Food in the Last Year: Never true  Transportation Needs: No Transportation Needs (04/13/2024)   PRAPARE - Administrator, Civil Service (Medical): No    Lack of Transportation (Non-Medical): No  Physical Activity: Not on file  Stress: Not on file  Social Connections: Not on file   Allergies  Allergen Reactions   Amoxicillin Hives   Penicillins Hives   No family history on file.   Current Outpatient Medications (Cardiovascular):    rosuvastatin (CRESTOR) 20 MG tablet, Take 20 mg by mouth daily.     Current Outpatient Medications (Other):    ALPRAZolam (XANAX) 0.5 MG tablet, Take 0.5 mg by mouth daily as needed for anxiety.   omeprazole (PRILOSEC) 20 MG capsule, Take 20 mg by mouth daily.   Reviewed prior external information including notes and imaging from  primary care provider As well as notes that were available from care everywhere and other healthcare systems.  Past medical history, social, surgical and family history all reviewed in electronic medical record.  No pertanent information unless stated regarding to the chief complaint.   Review of Systems:  No headache, visual changes, nausea, vomiting, diarrhea, constipation, dizziness, abdominal pain, skin rash, fevers,  chills, night sweats, weight loss, swollen lymph nodes, body aches, joint swelling, chest pain, shortness of breath, mood changes. POSITIVE muscle aches  Objective  There were no vitals taken for this visit.   General: No apparent distress alert and oriented x3 mood and affect normal, dressed appropriately.  HEENT: Pupils equal, extraocular movements intact  Respiratory: Patient's speak in full sentences and does not appear short of breath  Cardiovascular: No lower extremity edema, non tender, no erythema      Impression and Recommendations:

## 2024-05-24 ENCOUNTER — Ambulatory Visit: Admitting: Family Medicine

## 2024-05-24 ENCOUNTER — Ambulatory Visit (INDEPENDENT_AMBULATORY_CARE_PROVIDER_SITE_OTHER)

## 2024-05-24 VITALS — BP 108/74 | HR 75 | Ht 63.0 in | Wt 166.0 lb

## 2024-05-24 DIAGNOSIS — M47814 Spondylosis without myelopathy or radiculopathy, thoracic region: Secondary | ICD-10-CM | POA: Diagnosis not present

## 2024-05-24 DIAGNOSIS — R531 Weakness: Secondary | ICD-10-CM | POA: Diagnosis not present

## 2024-05-24 DIAGNOSIS — M5030 Other cervical disc degeneration, unspecified cervical region: Secondary | ICD-10-CM | POA: Diagnosis not present

## 2024-05-24 DIAGNOSIS — M50221 Other cervical disc displacement at C4-C5 level: Secondary | ICD-10-CM | POA: Diagnosis not present

## 2024-05-24 DIAGNOSIS — M546 Pain in thoracic spine: Secondary | ICD-10-CM

## 2024-05-24 DIAGNOSIS — M4804 Spinal stenosis, thoracic region: Secondary | ICD-10-CM | POA: Diagnosis not present

## 2024-05-24 DIAGNOSIS — R42 Dizziness and giddiness: Secondary | ICD-10-CM | POA: Diagnosis not present

## 2024-05-24 DIAGNOSIS — M4802 Spinal stenosis, cervical region: Secondary | ICD-10-CM | POA: Diagnosis not present

## 2024-05-24 DIAGNOSIS — M47812 Spondylosis without myelopathy or radiculopathy, cervical region: Secondary | ICD-10-CM | POA: Diagnosis not present

## 2024-05-24 DIAGNOSIS — M549 Dorsalgia, unspecified: Secondary | ICD-10-CM | POA: Diagnosis not present

## 2024-05-24 DIAGNOSIS — M419 Scoliosis, unspecified: Secondary | ICD-10-CM | POA: Diagnosis not present

## 2024-05-24 LAB — COMPREHENSIVE METABOLIC PANEL WITH GFR
ALT: 16 U/L (ref 0–35)
AST: 17 U/L (ref 0–37)
Albumin: 4.7 g/dL (ref 3.5–5.2)
Alkaline Phosphatase: 78 U/L (ref 39–117)
BUN: 14 mg/dL (ref 6–23)
CO2: 28 meq/L (ref 19–32)
Calcium: 9 mg/dL (ref 8.4–10.5)
Chloride: 104 meq/L (ref 96–112)
Creatinine, Ser: 0.72 mg/dL (ref 0.40–1.20)
GFR: 80.43 mL/min (ref >60.00)
Glucose, Bld: 95 mg/dL (ref 70–99)
Potassium: 3.9 meq/L (ref 3.5–5.1)
Sodium: 141 meq/L (ref 135–145)
Total Bilirubin: 1.6 mg/dL — ABNORMAL HIGH (ref 0.2–1.2)
Total Protein: 6.9 g/dL (ref 6.0–8.3)

## 2024-05-24 LAB — CBC WITH DIFFERENTIAL/PLATELET
Basophils Absolute: 0.1 K/uL (ref 0.0–0.1)
Basophils Relative: 0.9 % (ref 0.0–3.0)
Eosinophils Absolute: 0.1 K/uL (ref 0.0–0.7)
Eosinophils Relative: 1.1 % (ref 0.0–5.0)
HCT: 46.2 % — ABNORMAL HIGH (ref 36.0–46.0)
Hemoglobin: 16 g/dL — ABNORMAL HIGH (ref 12.0–15.0)
Lymphocytes Relative: 34.4 % (ref 12.0–46.0)
Lymphs Abs: 2.3 K/uL (ref 0.7–4.0)
MCHC: 34.6 g/dL (ref 30.0–36.0)
MCV: 88.3 fl (ref 78.0–100.0)
Monocytes Absolute: 0.5 K/uL (ref 0.1–1.0)
Monocytes Relative: 7.3 % (ref 3.0–12.0)
Neutro Abs: 3.8 K/uL (ref 1.4–7.7)
Neutrophils Relative %: 56.3 % (ref 43.0–77.0)
Platelets: 222 K/uL (ref 150.0–400.0)
RBC: 5.23 Mil/uL — ABNORMAL HIGH (ref 3.87–5.11)
RDW: 12.9 % (ref 11.5–15.5)
WBC: 6.8 K/uL (ref 4.0–10.5)

## 2024-05-24 LAB — IBC PANEL
Iron: 82 ug/dL (ref 42–145)
Saturation Ratios: 24.2 % (ref 20.0–50.0)
TIBC: 338.8 ug/dL (ref 250.0–450.0)
Transferrin: 242 mg/dL (ref 212.0–360.0)

## 2024-05-24 LAB — VITAMIN D 25 HYDROXY (VIT D DEFICIENCY, FRACTURES): VITD: 17.37 ng/mL — ABNORMAL LOW (ref 30.00–100.00)

## 2024-05-24 LAB — VITAMIN B12: Vitamin B-12: 464 pg/mL (ref 211–911)

## 2024-05-24 LAB — FERRITIN: Ferritin: 165.2 ng/mL (ref 10.0–291.0)

## 2024-05-24 LAB — TSH: TSH: 1.81 u[IU]/mL (ref 0.35–5.50)

## 2024-05-24 LAB — SEDIMENTATION RATE: Sed Rate: 4 mm/h (ref 0–30)

## 2024-05-24 NOTE — Patient Instructions (Signed)
 Labs today Xray today [facility] [phone] Call Today  When we receive your results we will contact you.

## 2024-05-25 ENCOUNTER — Other Ambulatory Visit: Payer: Self-pay | Admitting: Family Medicine

## 2024-05-25 ENCOUNTER — Encounter: Payer: Self-pay | Admitting: Family Medicine

## 2024-05-25 ENCOUNTER — Ambulatory Visit: Payer: Self-pay | Admitting: Family Medicine

## 2024-05-25 MED ORDER — VITAMIN D (ERGOCALCIFEROL) 1.25 MG (50000 UNIT) PO CAPS: 50000.0000 [IU] | ORAL_CAPSULE | ORAL | 0 refills | Status: AC

## 2024-05-25 NOTE — Progress Notes (Signed)
 Initial neurology clinic note  Reason for Evaluation: Consultation requested by Claudene Pellet, MD for an opinion regarding feeling of falling forward while walking. My final recommendations will be communicated back to the requesting physician by way of shared medical record or letter to requesting physician via [place]  mail.  HPI: This is Ms. Kara [name], a 78 y.o. right-handed female with a medical history of HLD, GERD, OA who presents to neurology clinic with the chief complaint of feeling of falling forward while walking. The patient is alone today.  About 23 years ago patient felt like she was getting a cold. Her legs felt very heavy and felt like she was walking through cement for about 3 years. 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 She saw a neurologist. She had an MRI brain that apparently didn't show much. She was not given a diagnosis. She decided to go back to teaching. The symptoms then resolved (about 3 years in duration). She was back to normal. She mentions prior to that she may have randomly heat water dripping down her leg and random heat sensation on the back of her hands.  She had no further symptoms until recently. In 02/2023, patient started feeling like she was going to fall forward. It would happen more when she was walking and would stop and then fell like her top half wanted to keep going. She never actually had a fall. She was seen at her PCP office and another MRI brain was done on 05/06/23. It showed no acute abnormality but did show chronic microvascular ischemia in the white matter and pons (moderate in severity). These symptoms went away after about 3 months and then she was back to normal.  This same sensation returned (falling forward) in 01/2024. One difference she noticed this year was that quads were weak and hamstrings would feel tight (sometimes for both symptoms, not constant). She denies any falls. She has brain fog. This continues to  current, but she thinks this is getting better. She thinks this is because she went to see Dr. Darlyn Claudene. She had lab work and found to have vit D deficiency. She is now taking vitamin D 50000 international units which she thinks might be helping. Per notes, Dr. Claudene is concerned for spinal stenosis and ordered MRI cervical and thoracic spine which is scheduled for 06/15/24.  She mentions that in 2024 and 2025 she felt like she would have a fever, but would not.  Patient also mentioned that at some point she stopped Crestor that she had been on for 20 years. This did not help symptoms, so this was resumed after 1 week.  She has also seen hematology due to elevated ferritin and polycythemia. Elevated is thought to be elevated due to chronic inflammation (due to arthritis).   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 This lasts 15 minutes then goes away without a headache. This had not happened for 8 years but then she has had 3 episodes in the last 6 weeks.  Son was diagnosed with MS 13 years ago, so this was a concern for patient.  She takes vit B12 (unknown dosage) about every other day.  Her sense of smell is good. She can have a tremor when holding something heavy, but not at rest. She denies any symptoms of REM sleep disorder. She can occasionally have difficulty initiating movement, but this is not all the time.  She  denies muscle pain. She will sometimes have cramping in the left foot, but otherwise denies cramps or twitching.  She does not report any constitutional symptoms like fever, night sweats, anorexia or unintentional weight loss.  She lives alone. Her husband died about 1.5 years ago. EtOH use: Very rare (few times per day)  Restrictive diet? No Family history of neuropathy/myopathy/neurologic disease? Son with MS  MEDICATIONS:  Outpatient Encounter Medications as of 06/03/2024  Medication Sig   ALPRAZolam (XANAX) 0.5 MG tablet  Take 0.5 mg by mouth daily as needed for anxiety.   omeprazole (PRILOSEC) 20 MG capsule Take 20 mg by mouth daily.   rosuvastatin (CRESTOR) 20 MG tablet Take 20 mg by mouth daily.   Vitamin D, Ergocalciferol, (DRISDOL) 1.25 MG (50000 UNIT) CAPS capsule Take 1 capsule (50,000 Units total) by mouth every 7 (seven) days.   No facility-administered encounter medications on file as of 06/03/2024.    PAST MEDICAL HISTORY: Past Medical History:  Diagnosis Date   Acid reflux    GERD (gastroesophageal reflux disease)    Hypercholesteremia    Hyperlipidemia     PAST SURGICAL HISTORY: Past Surgical History:  Procedure Laterality Date   HERNIA REPAIR      ALLERGIES: Allergies  Allergen Reactions   Amoxicillin Hives   Penicillins Hives    FAMILY HISTORY: Family History  Problem Relation Age of Onset   Lung cancer Mother    Heart Problems Father    Diabetes Father     SOCIAL HISTORY: Social History   Tobacco Use   Smoking status: Never   Smokeless tobacco: Never  Vaping Use   Vaping status: Never Used  Substance Use Topics   Alcohol use: Not Currently   Drug use: No   Social History   Social History Narrative   Are you right handed or left handed? Right   Are you currently employed ?    What is your current occupation? retired   Do you live at home alone? yes   Who lives with you?    What type of home do you live in: 1 story or 2 story? two    Caffiene occ.     OBJECTIVE: PHYSICAL EXAM: BP 119/66   Pulse 78   Ht 5' 3 (1.6 m)   Wt 165 lb (74.8 kg)   SpO2 95%   BMI 29.23 kg/m   General: General appearance: Awake and alert. No distress. Cooperative with exam.  Skin: No obvious rash or jaundice. HEENT: Atraumatic. Anicteric. Lungs: Non-labored breathing on room air  Heart: Regular Psych: Affect appropriate.  Neurological: Mental Status: Alert. Speech fluent. No pseudobulbar affect Cranial Nerves: CNII: No RAPD. Visual fields grossly intact. CNIII,  IV, VI: PERRL. No nystagmus. EOMI. CN V: Facial sensation intact bilaterally to fine touch. CN VII: Facial muscles symmetric and strong. No ptosis at rest. CN VIII: Hearing grossly intact bilaterally. CN IX: No hypophonia. CN X: Palate elevates symmetrically. CN XI: Full strength shoulder shrug bilaterally. CN XII: Tongue protrusion full and midline. No atrophy or fasciculations. No significant dysarthria Motor: Tone is normal. Kinetic tremor in in bilateral hands.  Individual muscle group testing (MRC grade out of 5):  Movement     Neck flexion 5    Neck extension 5     Right Left   Shoulder abduction 5 5   Shoulder adduction 5 5   Elbow flexion 5 5   Elbow extension 5 5   Finger abduction - FDI 5 5  Finger abduction - ADM 5 5   Finger extension 5 5   Finger distal flexion - 2/3 5 5    Finger distal flexion - 4/5 5 5    Thumb flexion - FPL 5 5   Thumb abduction - APB 5 5    Hip flexion 5 5   Hip extension 5 5   Hip adduction 5 5   Hip abduction 5 5   Knee extension 5 5   Knee flexion 5 5   Dorsiflexion 5 5   Plantarflexion 5 5   Inversion 5 5   Eversion 5 5   Great toe extension 5 5   Great toe flexion 5 5     Reflexes:  Right Left   Bicep 2+ 2+   Tricep 2+ 2+   BrRad 2+ 2+   Knee 2+ 2+   Ankle Trace Trace    Pathological Reflexes: Babinski: mute response bilaterally Hoffman: absent bilaterally Troemner: absent bilaterally Sensation: Pinprick: Intact in all extremities except in small patch in right lateral lower leg (intact above calf and in foot) Vibration: Intact in all extremities Proprioception: Intact in bilateral great toes Coordination: Intact finger-to- nose-finger bilaterally. Romberg negative. Finger tapping, hand opening, and toe tapping all normal without decrement Gait: Able to rise from chair with arms crossed unassisted. Normal, narrow-based gait. No freezing. Normal turns. Normal arm swing. Normal posture.  Lab and Test Review: Internal  labs: 05/24/24: CBC w/ diff significant for Hb 16.0 CMP significant for tbili 1.6 Ferritin 165.2 ESR 4 TSH wnl B12: 464 Vit D low at 17.37  External labs: 03/17/24: TSH wnl Lipid panel: tChol 146, LDL 75, TG 180 CMP significant for tbili 2.4 CBC w/ diff significant for Hb 16.3 Vit D low at 23.1 Ferritin mildly elevated to 332.3 B12: 413 Mg 2.2  Imaging/Procedures: Cervical spine xray (03/29/21): Trace retrolisthesis of C4 on C5 and C5 on C6. Trace anterolisthesis of C3 on C4. Disc space narrowing and endplate spurring from C3-C4 through C6-C7. Multilevel facet hypertrophy more prominent on the right, greatest at C3-C4. Vertebral body heights are normal. No evidence of fracture, focal bone lesion or bone destruction. There is no prevertebral soft tissue edema. Lung apices are clear.   IMPRESSION: 1. 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 2. Trace retrolisthesis of C4 on C5 and C5 on C6 and anterolisthesis of C3 on C4, likely facet mediated.  MRI brain w/wo contrast (05/06/23): No age advanced or lobar predominant parenchymal atrophy.   Multifocal T2 FLAIR hyperintense signal abnormality within the cerebral white matter and pons, nonspecific but compatible with moderate for age chronic small vessel ischemic disease.   Partially empty sella turcica.   No cortical encephalomalacia is identified.   There is no acute infarct.   No evidence of an intracranial mass.   No chronic intracranial blood products.   No extra-axial fluid collection.   No midline shift.   No pathologic intracranial enhancement identified.   Vascular: Maintained flow voids within the proximal large arterial vessels.   Skull and upper cervical spine: No focal suspicious marrow lesion. Incompletely assessed cervical spondylosis.   Sinuses/Orbits: No mass or acute finding within the imaged orbits. Prior right ocular lens replacement. 2 cm mucous  retention cyst within the left maxillary sinus.   IMPRESSION: 1. No evidence of an acute intracranial abnormality. 2. Chronic small vessel ischemic changes within the cerebral white matter and pons, overall moderate in severity. 3. 2 cm left maxillary sinus mucous  retention cyst.  Carotid ultrasound (05/26/24): IMPRESSION: 1. Minor carotid atherosclerosis. Negative for significant stenosis. Degree of narrowing less than 50% bilaterally by ultrasound criteria. 2. Patent antegrade vertebral flow bilaterally.  ASSESSMENT: KHERINGTON MERAZ is a 78 y.o. female who presents for evaluation of a sensation of falling forward when walking, particularly when coming to a stop. She has a relevant medical history of HLD, GERD, OA. Her neurological examination is essentially normal today. Available diagnostic data is significant for low vitamin D, normal B12, degenerative changes on cervical spine xray, and MRI brain with chronic microvascular ischemia, including in the pons. The etiology of patient's symptoms is currently unclear. There have been transient neurologic deficits dating back since patient was in her 18s that will come on then resolve on their own. There has never been an explanation previously. Similarly, I'm not sure the cause now. She has no clear evidence of parkinsonism, myopathy, or neuropathy. Spinal stenosis is possible, but there is no clear myelopathy on exam.  PLAN: -Blood work: CK, B1, copper, Vit E, IFE -Follow up on MRI cervical and thoracic spine scheduled for 06/15/24 -Fall precautions discussed -Continue Vit D supplementation  -Return to clinic in 6 months  The impression above as well as the plan as outlined below were extensively discussed with the patient who voiced understanding. All questions were answered to their satisfaction.  The patient was counseled on pertinent fall precautions per the printed material provided today, and as noted under the Patient  Instructions section below.  When available, results of the above investigations and possible further recommendations will be communicated to the patient via telephone/MyChart. Patient to call office if not contacted after expected testing turnaround time.   Total time spent reviewing records, interview, history/exam, documentation, and coordination of care on day of encounter:  75 min   Thank you for allowing me to participate in patient's care.  If I can answer any additional questions, I would be pleased to do so.  Kara Potters, MD   CC: Claudene Pellet, MD [address] [place] [address]  CC: Referring provider: Claudene Pellet, MD [phone] [address] [place],  [place] [address]

## 2024-05-25 NOTE — Assessment & Plan Note (Signed)
 Known degenerative disc disease but patient is having worsening symptoms.  Affecting daily activities.  No radicular symptoms down the arms.  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  Concerned that there is potential nerve root impingement and likely spinal stenosis at this time.  With the weakness noted do feel advanced imaging is warranted and MRI of the cervical and thoracic secondary to midline tenderness also noted in the thoracic vertebrae.  Depending on findings we will discuss with patient about different treatment options.  Hold on follow-up until we know which treatments we are going to provide for this individual.  Patient knows of worsening symptoms to seek medical attention immediately.

## 2024-05-26 ENCOUNTER — Ambulatory Visit
Admission: RE | Admit: 2024-05-26 | Discharge: 2024-05-26 | Disposition: A | Discharge status: Home / Self Care | Source: Ambulatory Visit | Attending: Family Medicine | Admitting: Family Medicine

## 2024-05-26 ENCOUNTER — Encounter: Payer: Self-pay | Admitting: Family Medicine

## 2024-05-26 DIAGNOSIS — I6523 Occlusion and stenosis of bilateral carotid arteries: Secondary | ICD-10-CM | POA: Diagnosis not present

## 2024-05-26 DIAGNOSIS — R42 Dizziness and giddiness: Secondary | ICD-10-CM

## 2024-05-26 LAB — PTH, INTACT AND CALCIUM
Calcium: 9.2 mg/dL (ref 8.6–10.4)
PTH: 93 pg/mL — ABNORMAL HIGH (ref 16–77)

## 2024-06-03 ENCOUNTER — Ambulatory Visit: Admitting: Neurology

## 2024-06-03 ENCOUNTER — Other Ambulatory Visit

## 2024-06-03 ENCOUNTER — Encounter: Payer: Self-pay | Admitting: Neurology

## 2024-06-03 VITALS — BP 119/66 | HR 78 | Ht 63.0 in | Wt 165.0 lb

## 2024-06-03 DIAGNOSIS — R209 Unspecified disturbances of skin sensation: Secondary | ICD-10-CM | POA: Diagnosis not present

## 2024-06-03 DIAGNOSIS — R531 Weakness: Secondary | ICD-10-CM | POA: Diagnosis not present

## 2024-06-03 DIAGNOSIS — R2689 Other abnormalities of gait and mobility: Secondary | ICD-10-CM

## 2024-06-03 DIAGNOSIS — R269 Unspecified abnormalities of gait and mobility: Secondary | ICD-10-CM

## 2024-06-03 NOTE — Patient Instructions (Signed)
 I saw you today for multiple symptoms, but specifically the sensation of falling forward when walking (stopping).   I'm not sure the cause of your symptoms. Fortunately, your neurologic exam looks good. I want to investigate further with lab work today.   I will follow up on your upcoming MRIs as well.  When I have the results I will be in touch about any potential next steps.  We will at a minimum monitor your symptoms closely. It would not surprise me if this gets better again on its own, but let me know if not.   I plan to see you again in clinic in 6 months to re-examine you.  Please let me know if you have any questions or concerns in the meantime.  The physicians and staff at [facility] are committed to providing excellent care. You may receive a survey requesting feedback about your experience at our office. We strive to receive very good responses to the survey questions. If you feel that your experience would prevent you from giving the office a very good  response, please contact our office to try to remedy the situation. We may be reached at [phone]. Thank you for taking the time out of your busy day to complete the survey.  Venetia Potters, MD [facility]  Preventing Falls at [facility] are common, often dreaded events in the lives of older people. Aside from the obvious injuries and even death that may result, fall can cause wide-ranging consequences including loss of independence, mental decline, decreased activity and mobility. Younger people are also at risk of falling, especially those with chronic illnesses and fatigue.  Ways to reduce risk for falling Examine diet and medications. Warm foods and alcohol dilate blood vessels, which can lead to dizziness when standing. Sleep aids, antidepressants and pain medications can also increase the likelihood of a fall.  Get a vision exam. Poor vision, cataracts and glaucoma increase the chances of  falling.  Check foot gear. Shoes should fit snugly and have a sturdy, nonskid sole and a broad, low heel  Participate in a physician-approved exercise program to build and maintain muscle strength and improve balance and coordination. Programs that use ankle weights or stretch bands are excellent for muscle-strengthening. Water aerobics programs and low-impact Tai Chi programs have also been shown to improve balance and coordination.  Increase vitamin D intake. Vitamin D improves muscle strength and increases the amount of calcium the body is able to absorb and deposit in bones.  How to prevent falls from common hazards Floors - Remove all loose wires, cords, and throw rugs. Minimize clutter. Make sure rugs are anchored and smooth. Keep furniture in its usual place.  Chairs -- Use chairs with straight backs, armrests and firm seats. Add firm cushions to existing pieces to add height.  Bathroom - Install grab bars and non-skid tape in the tub or shower. Use a bathtub transfer bench or a shower chair with a back support Use an elevated toilet seat and/or safety rails to assist standing from a low surface. Do not use towel racks or bathroom tissue holders to help you stand.  Lighting - Make sure halls, stairways, and entrances are well-lit. Install a night light in your bathroom or hallway. Make sure there is a light switch at the top and bottom of the staircase. Turn lights on if you get up in the middle of the night. Make sure lamps or light switches are within reach of the bed if you  have to get up during the night.  Kitchen - Install non-skid rubber mats near the sink and stove. Clean spills immediately. Store frequently used utensils, pots, pans between waist and eye level. This helps prevent reaching and bending. Sit when getting things out of lower cupboards.  Living room/ Bedrooms - Place furniture with wide spaces in between, giving enough room to move around. Establish a route through the  living room that gives you something to hold onto as you walk.  Stairs - Make sure treads, rails, and rugs are secure. Install a rail on both sides of the stairs. If stairs are a threat, it might be helpful to arrange most of your activities on the lower level to reduce the number of times you must climb the stairs.  Entrances and doorways - Install metal handles on the walls adjacent to the doorknobs of all doors to make it more secure as you travel through the doorway.  Tips for maintaining balance Keep at least one hand free at all times. Try using a backpack or fanny pack to hold things rather than carrying them in your hands. Never carry objects in both hands when walking as this interferes with keeping your balance.  Attempt to swing both arms from front to back while walking. This might require a conscious effort if Parkinson's disease has diminished your movement. It will, however, help you to maintain balance and posture, and reduce fatigue.  Consciously lift your feet off of the ground when walking. Shuffling and dragging of the feet is a common culprit in losing your balance.  When trying to navigate turns, use a U technique of facing forward and making a wide turn, rather than pivoting sharply.  Try to stand with your feet shoulder-length apart. When your feet are close together for any length of time, you increase your risk of losing your balance and falling.  Do one thing at a time. Don't try to walk and accomplish another task, such as reading or looking around. The decrease in your automatic reflexes complicates motor function, so the less distraction, the better.  Do not wear rubber or gripping soled shoes, they might catch on the floor and cause tripping.  Move slowly when changing positions. Use deliberate, concentrated movements and, if needed, use a grab bar or walking aid. Count 15 seconds between each movement. For example, when rising from a seated position, wait 15  seconds after standing to begin walking.  If balance is a continuous problem, you might want to consider a walking aid such as a cane, walking stick, or walker. Once you've mastered walking with help, you might be ready to try it on your own again.

## 2024-06-08 ENCOUNTER — Other Ambulatory Visit

## 2024-06-09 ENCOUNTER — Ambulatory Visit: Payer: Self-pay | Admitting: Neurology

## 2024-06-09 ENCOUNTER — Encounter: Payer: Self-pay | Admitting: Neurology

## 2024-06-09 ENCOUNTER — Ambulatory Visit: Admitting: Family Medicine

## 2024-06-09 LAB — CK: Total CK: 46 U/L (ref 18–225)

## 2024-06-09 LAB — IMMUNOFIXATION ELECTROPHORESIS
IgM, Serum: 64 mg/dL (ref 50–300)
IgM, Serum: 714 mg/dL (ref 600–300)
Immunoglobulin A: 133 mg/dL (ref 70–320)
Immunoglobulin A: 714 mg/dL (ref 70–320)

## 2024-06-09 LAB — VITAMIN E

## 2024-06-09 LAB — VITAMIN B1: Vitamin B1 (Thiamine): 15 nmol/L (ref 8–30)

## 2024-06-09 LAB — COPPER, SERUM: Copper: 109 ug/dL (ref 70–175)

## 2024-06-15 ENCOUNTER — Ambulatory Visit
Admission: RE | Admit: 2024-06-15 | Discharge: 2024-06-15 | Disposition: A | Discharge status: Home / Self Care | Source: Ambulatory Visit | Attending: Family Medicine | Admitting: Family Medicine

## 2024-06-15 DIAGNOSIS — M4802 Spinal stenosis, cervical region: Secondary | ICD-10-CM | POA: Diagnosis not present

## 2024-06-15 DIAGNOSIS — M5020 Other cervical disc displacement, unspecified cervical region: Secondary | ICD-10-CM | POA: Diagnosis not present

## 2024-06-15 DIAGNOSIS — M546 Pain in thoracic spine: Secondary | ICD-10-CM

## 2024-06-15 DIAGNOSIS — M47812 Spondylosis without myelopathy or radiculopathy, cervical region: Secondary | ICD-10-CM | POA: Diagnosis not present

## 2024-06-15 DIAGNOSIS — M5030 Other cervical disc degeneration, unspecified cervical region: Secondary | ICD-10-CM

## 2024-06-15 DIAGNOSIS — M47814 Spondylosis without myelopathy or radiculopathy, thoracic region: Secondary | ICD-10-CM | POA: Diagnosis not present

## 2024-06-16 ENCOUNTER — Other Ambulatory Visit: Payer: Self-pay

## 2024-06-16 DIAGNOSIS — E041 Nontoxic single thyroid nodule: Secondary | ICD-10-CM

## 2024-06-16 DIAGNOSIS — M5030 Other cervical disc degeneration, unspecified cervical region: Secondary | ICD-10-CM

## 2024-06-24 ENCOUNTER — Other Ambulatory Visit

## 2024-06-29 ENCOUNTER — Other Ambulatory Visit

## 2024-07-05 ENCOUNTER — Other Ambulatory Visit: Payer: Self-pay | Admitting: Hematology and Oncology

## 2024-07-05 ENCOUNTER — Other Ambulatory Visit: Payer: Self-pay

## 2024-07-05 DIAGNOSIS — D751 Secondary polycythemia: Secondary | ICD-10-CM

## 2024-07-06 ENCOUNTER — Inpatient Hospital Stay: Admitting: Hematology and Oncology

## 2024-07-06 ENCOUNTER — Inpatient Hospital Stay: Attending: Hematology and Oncology

## 2024-07-06 VITALS — BP 131/76 | HR 78 | Temp 98.6°F | Resp 18 | Ht 63.0 in | Wt 165.4 lb

## 2024-07-06 DIAGNOSIS — E559 Vitamin D deficiency, unspecified: Secondary | ICD-10-CM | POA: Diagnosis not present

## 2024-07-06 DIAGNOSIS — D751 Secondary polycythemia: Secondary | ICD-10-CM

## 2024-07-06 DIAGNOSIS — Z880 Allergy status to penicillin: Secondary | ICD-10-CM | POA: Diagnosis not present

## 2024-07-06 DIAGNOSIS — Z79899 Other long term (current) drug therapy: Secondary | ICD-10-CM | POA: Diagnosis not present

## 2024-07-06 LAB — IRON AND IRON BINDING CAPACITY (CC-WL,HP ONLY)
Iron: 77 ug/dL (ref 28–170)
Saturation Ratios: 25 % (ref 10.4–31.8)
TIBC: 309 ug/dL (ref 250–450)
UIBC: 233 ug/dL

## 2024-07-06 LAB — CMP (CANCER CENTER ONLY)
ALT: 15 U/L (ref 0–44)
AST: 22 U/L (ref 15–41)
Albumin: 4.4 g/dL (ref 3.5–5.0)
Alkaline Phosphatase: 89 U/L (ref 38–126)
Anion gap: 12 (ref 5–15)
BUN: 15 mg/dL (ref 8–23)
CO2: 24 mmol/L (ref 22–32)
Calcium: 9.3 mg/dL (ref 8.9–10.3)
Chloride: 106 mmol/L (ref 98–111)
Creatinine: 0.77 mg/dL (ref 0.44–1.00)
GFR, Estimated: >60 mL/min (ref >60)
Glucose, Bld: 100 mg/dL — ABNORMAL HIGH (ref 70–99)
Potassium: 3.6 mmol/L (ref 3.5–5.1)
Sodium: 141 mmol/L (ref 135–145)
Total Bilirubin: 1.5 mg/dL — ABNORMAL HIGH (ref 0.0–1.2)
Total Protein: 6.6 g/dL (ref 6.5–8.1)

## 2024-07-06 LAB — CBC WITH DIFFERENTIAL (CANCER CENTER ONLY)
Abs Immature Granulocytes: 0.02 K/uL (ref 0.00–0.07)
Basophils Absolute: 0.1 K/uL (ref 0.0–0.1)
Basophils Relative: 1 %
Eosinophils Absolute: 0.1 K/uL (ref 0.0–0.5)
Eosinophils Relative: 1 %
HCT: 44.1 % (ref 36.0–46.0)
Hemoglobin: 15.9 g/dL — ABNORMAL HIGH (ref 12.0–15.0)
Immature Granulocytes: 0 %
Lymphocytes Relative: 35 %
Lymphs Abs: 2.5 K/uL (ref 0.7–4.0)
MCH: 30.8 pg (ref 26.0–34.0)
MCHC: 36.1 g/dL — ABNORMAL HIGH (ref 30.0–36.0)
MCV: 85.5 fL (ref 80.0–100.0)
Monocytes Absolute: 0.5 K/uL (ref 0.1–1.0)
Monocytes Relative: 7 %
Neutro Abs: 3.9 K/uL (ref 1.7–7.7)
Neutrophils Relative %: 56 %
Platelet Count: 238 K/uL (ref 150–400)
RBC: 5.16 MIL/uL — ABNORMAL HIGH (ref 3.87–5.11)
RDW: 11.9 % (ref 11.5–15.5)
WBC Count: 7 K/uL (ref 4.0–10.5)
nRBC: 0 % (ref 0.0–0.2)

## 2024-07-06 LAB — FERRITIN: Ferritin: 314 ng/mL — ABNORMAL HIGH (ref 11–307)

## 2024-07-06 NOTE — Assessment & Plan Note (Addendum)
 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      Treatment plan: Phlebotomy based upon symptoms: Goal is to keep hematocrit below 45 JAK2 and MPL panel: Negative, confirming secondary polycythemia Recheck labs in 3 months and follow-up   Elevated ferritin: With normal iron saturation, most likely related to underlying inflammation possibly due to arthritis in the neck

## 2024-07-06 NOTE — Progress Notes (Signed)
 Patient Care Team: Claudene Pellet, MD as PCP - General (Family Medicine)  DIAGNOSIS:  Encounter Diagnosis  Name Primary?   Secondary polycythemia Yes      CHIEF COMPLIANT: Follow-up of secondary polycythemia  HISTORY OF PRESENT ILLNESS:   History of Present Illness Kara [name] is a 78 year old female who presents for follow-up on her hematologic condition. She has been under the care of Dr. Zach Smith from sports medicine for the past four years.  She feels significantly better. Workup over the past four years, including neck and back x-rays and blood work, identified a significant vitamin D  deficiency. She was started on vitamin D  50,000 IU daily with resolution of prior sensations of toppling over and leg weakness within about four days, and these symptoms have been absent over the past week.  Her elevated hemoglobin has been closely monitored, with prior values up to 16.9 and 16.6. After a phlebotomy on September 22, hemoglobin decreased from 16.4 to 16.0. Today her hemoglobin is 15.9 with hematocrit 44.1, which is below the treatment threshold of 45. She has no persistent headaches or unsteadiness.     ALLERGIES:  is allergic to amoxicillin and penicillins.  MEDICATIONS:  Current Outpatient Medications  Medication Sig Dispense Refill   ALPRAZolam (XANAX) 0.5 MG tablet Take 0.5 mg by mouth daily as needed for anxiety.     omeprazole (PRILOSEC) 20 MG capsule Take 20 mg by mouth daily.     rosuvastatin (CRESTOR) 20 MG tablet Take 20 mg by mouth daily.     Vitamin D , Ergocalciferol , (DRISDOL ) 1.25 MG (50000 UNIT) CAPS capsule Take 1 capsule (50,000 Units total) by mouth every 7 (seven) days. 12 capsule 0   No current facility-administered medications for this visit.    PHYSICAL EXAMINATION: ECOG PERFORMANCE STATUS: 1 - Symptomatic but completely ambulatory  Vitals:   07/06/24 1543  BP: 131/76  Pulse: 78  Resp: 18  Temp: 98.6 F (37 C)  SpO2: 98%   Filed  Weights   07/06/24 1543  Weight: 165 lb 6.4 oz (75 kg)      LABORATORY DATA:  I have reviewed the data as listed    Latest Ref Rng & Units 07/06/2024    2:51 PM 05/24/2024    3:16 PM 10/24/2017    2:38 PM  CMP  Glucose 70 - 99 mg/dL 899  95  93   BUN 8 - 23 mg/dL 15  14  14    Creatinine 0.44 - 1.00 mg/dL 9.22  9.27  9.19   Sodium 135 - 145 mmol/L 141  141  141   Potassium 3.5 - 5.1 mmol/L 3.6  3.9  3.9   Chloride 98 - 111 mmol/L 106  104  106   CO2 22 - 32 mmol/L 24  28  25    Calcium 8.9 - 10.3 mg/dL 9.3  9.2    9.0  89.9   Total Protein 6.5 - 8.1 g/dL 6.6  6.9  7.2   Total Bilirubin 0.0 - 1.2 mg/dL 1.5  1.6  1.7   Alkaline Phos 38 - 126 U/L 89  78  92   AST 15 - 41 U/L 22  17  16    ALT 0 - 44 U/L 15  16  19      Lab Results  Component Value Date   WBC 7.0 07/06/2024   HGB 15.9 (H) 07/06/2024   HCT 44.1 07/06/2024   MCV 85.5 07/06/2024   PLT 238 07/06/2024   NEUTROABS  3.9 07/06/2024    ASSESSMENT & PLAN:  Secondary polycythemia Lab review: 10/24/2017: Hemoglobin 16.6, erythropoietin  14.1 03/04/2024: Hemoglobin 16.6 03/17/2024: Hemoglobin 16.3, hematocrit 45.9, WBC 6.3, platelets 233, iron studies: Iron saturation 25%, ferritin 332, B12 413 Previously patient was evaluated by hematology: Felt to be secondary polycythemia.      Prior phlebotomy: September 2025   Treatment plan: Phlebotomy based upon symptoms: Goal is to keep hematocrit below 45 (today's hematocrit is 44) JAK2 and MPL panel: Negative, confirming secondary polycythemia I encouraged her to do blood donation twice a year.   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 No phlebotomy needed. Negative genetic mutation analysis.  Condition stable. - Monitor hemoglobin and hematocrit annually. - Encouraged biannual blood donation to [facility]. - Advised to report new symptoms like persistent headaches or unsteadiness.      No orders of the defined types were placed in this encounter.  The patient has a good understanding of the overall plan. she agrees with it. she will call with any problems that may develop before the next visit here.  I personally spent a total of 30 minutes in the care of the patient today including preparing to see the patient, getting/reviewing separately obtained history, performing a medically appropriate exam/evaluation, counseling and educating, placing orders, referring and communicating with other health care professionals, documenting clinical information in the EHR, independently interpreting results, communicating results, and coordinating care.   Viinay K Lianah Peed, MD 07/06/24

## 2024-07-07 ENCOUNTER — Ambulatory Visit
Admission: RE | Admit: 2024-07-07 | Discharge: 2024-07-07 | Disposition: A | Source: Ambulatory Visit | Attending: Family Medicine | Admitting: Family Medicine

## 2024-07-07 DIAGNOSIS — E041 Nontoxic single thyroid nodule: Secondary | ICD-10-CM

## 2024-07-09 ENCOUNTER — Other Ambulatory Visit: Payer: Self-pay | Admitting: Family Medicine

## 2024-07-09 ENCOUNTER — Ambulatory Visit: Payer: Self-pay | Admitting: Family Medicine

## 2024-07-09 DIAGNOSIS — E041 Nontoxic single thyroid nodule: Secondary | ICD-10-CM

## 2024-07-15 ENCOUNTER — Other Ambulatory Visit: Payer: Self-pay | Admitting: Family Medicine

## 2024-07-15 ENCOUNTER — Other Ambulatory Visit: Payer: Self-pay | Admitting: General Surgery

## 2024-07-15 DIAGNOSIS — E041 Nontoxic single thyroid nodule: Secondary | ICD-10-CM

## 2024-07-19 ENCOUNTER — Other Ambulatory Visit

## 2024-08-04 ENCOUNTER — Ambulatory Visit (HOSPITAL_BASED_OUTPATIENT_CLINIC_OR_DEPARTMENT_OTHER)
Admission: RE | Admit: 2024-08-04 | Discharge: 2024-08-04 | Disposition: A | Discharge status: Home / Self Care | Source: Ambulatory Visit | Attending: Family Medicine | Admitting: Family Medicine

## 2024-08-04 DIAGNOSIS — E2839 Other primary ovarian failure: Secondary | ICD-10-CM | POA: Diagnosis present

## 2024-08-09 ENCOUNTER — Encounter: Payer: Self-pay | Admitting: Family Medicine

## 2024-08-10 ENCOUNTER — Other Ambulatory Visit: Payer: Self-pay | Admitting: Family Medicine

## 2024-08-10 DIAGNOSIS — R42 Dizziness and giddiness: Secondary | ICD-10-CM

## 2024-08-11 ENCOUNTER — Ambulatory Visit: Payer: Self-pay | Admitting: Family Medicine

## 2024-08-11 ENCOUNTER — Other Ambulatory Visit

## 2024-08-11 DIAGNOSIS — R42 Dizziness and giddiness: Secondary | ICD-10-CM | POA: Diagnosis not present

## 2024-08-11 LAB — VITAMIN D 25 HYDROXY (VIT D DEFICIENCY, FRACTURES): VITD: 35.44 ng/mL (ref 30.00–100.00)

## 2024-08-16 ENCOUNTER — Other Ambulatory Visit (HOSPITAL_COMMUNITY)
Admission: RE | Admit: 2024-08-16 | Discharge: 2024-08-16 | Disposition: A | Discharge status: Home / Self Care | Source: Ambulatory Visit

## 2024-08-16 ENCOUNTER — Inpatient Hospital Stay
Admission: RE | Admit: 2024-08-16 | Discharge: 2024-08-16 | Disposition: A | Source: Ambulatory Visit | Attending: General Surgery

## 2024-08-16 DIAGNOSIS — E041 Nontoxic single thyroid nodule: Secondary | ICD-10-CM | POA: Diagnosis present

## 2024-08-18 LAB — CYTOLOGY - NON PAP

## 2024-08-19 ENCOUNTER — Other Ambulatory Visit: Payer: Self-pay

## 2024-08-19 MED ORDER — VITAMIN D (ERGOCALCIFEROL) 1.25 MG (50000 UNIT) PO CAPS: 50000.0000 [IU] | ORAL_CAPSULE | ORAL | 0 refills | Status: AC

## 2024-12-08 ENCOUNTER — Ambulatory Visit: Admitting: Neurology

## 2025-07-07 ENCOUNTER — Inpatient Hospital Stay: Attending: Hematology and Oncology

## 2025-07-07 ENCOUNTER — Inpatient Hospital Stay: Admitting: Hematology and Oncology
